# Patient Record
Sex: Female | Born: 1950
Health system: Southern US, Community
[De-identification: ages and names within clinical notes are randomized; demographics above are authoritative.]

---

## 1999-04-21 ENCOUNTER — Other Ambulatory Visit: Admission: RE | Admit: 1999-04-21 | Discharge: 1999-04-21 | Payer: Self-pay | Admitting: Gynecology

## 2000-07-28 ENCOUNTER — Other Ambulatory Visit: Admission: RE | Admit: 2000-07-28 | Discharge: 2000-07-28 | Payer: Self-pay | Admitting: Gynecology

## 2000-08-02 ENCOUNTER — Encounter: Payer: Self-pay | Admitting: Gynecology

## 2000-08-02 ENCOUNTER — Encounter: Admission: RE | Admit: 2000-08-02 | Discharge: 2000-08-02 | Payer: Self-pay | Admitting: Gynecology

## 2001-12-20 ENCOUNTER — Other Ambulatory Visit: Admission: RE | Admit: 2001-12-20 | Discharge: 2001-12-20 | Payer: Self-pay | Admitting: Gynecology

## 2001-12-25 ENCOUNTER — Encounter: Payer: Self-pay | Admitting: Gynecology

## 2001-12-25 ENCOUNTER — Encounter: Admission: RE | Admit: 2001-12-25 | Discharge: 2001-12-25 | Payer: Self-pay | Admitting: Gynecology

## 2002-12-24 ENCOUNTER — Other Ambulatory Visit: Admission: RE | Admit: 2002-12-24 | Discharge: 2002-12-24 | Payer: Self-pay | Admitting: Gynecology

## 2002-12-24 ENCOUNTER — Encounter: Payer: Self-pay | Admitting: Gynecology

## 2002-12-24 ENCOUNTER — Encounter: Admission: RE | Admit: 2002-12-24 | Discharge: 2002-12-24 | Payer: Self-pay | Admitting: Gynecology

## 2004-01-14 ENCOUNTER — Other Ambulatory Visit: Admission: RE | Admit: 2004-01-14 | Discharge: 2004-01-14 | Payer: Self-pay | Admitting: Gynecology

## 2004-01-22 ENCOUNTER — Encounter: Admission: RE | Admit: 2004-01-22 | Discharge: 2004-01-22 | Payer: Self-pay | Admitting: Gynecology

## 2005-03-30 ENCOUNTER — Other Ambulatory Visit: Admission: RE | Admit: 2005-03-30 | Discharge: 2005-03-30 | Payer: Self-pay | Admitting: Gynecology

## 2005-03-31 ENCOUNTER — Encounter: Admission: RE | Admit: 2005-03-31 | Discharge: 2005-03-31 | Payer: Self-pay | Admitting: Gynecology

## 2006-04-19 ENCOUNTER — Other Ambulatory Visit: Admission: RE | Admit: 2006-04-19 | Discharge: 2006-04-19 | Payer: Self-pay | Admitting: Gynecology

## 2006-04-19 ENCOUNTER — Encounter: Admission: RE | Admit: 2006-04-19 | Discharge: 2006-04-19 | Payer: Self-pay | Admitting: Gynecology

## 2006-04-19 ENCOUNTER — Encounter (INDEPENDENT_AMBULATORY_CARE_PROVIDER_SITE_OTHER): Payer: Self-pay | Admitting: Gynecology

## 2007-01-01 ENCOUNTER — Encounter: Admission: RE | Admit: 2007-01-01 | Discharge: 2007-01-01 | Payer: Self-pay | Admitting: Gynecology

## 2007-02-13 ENCOUNTER — Encounter: Admission: RE | Admit: 2007-02-13 | Discharge: 2007-02-13 | Payer: Self-pay | Admitting: Gastroenterology

## 2007-06-22 ENCOUNTER — Other Ambulatory Visit: Admission: RE | Admit: 2007-06-22 | Discharge: 2007-06-22 | Payer: Self-pay | Admitting: Gynecology

## 2007-06-22 ENCOUNTER — Encounter: Admission: RE | Admit: 2007-06-22 | Discharge: 2007-06-22 | Payer: Self-pay | Admitting: Gynecology

## 2007-08-13 ENCOUNTER — Encounter (INDEPENDENT_AMBULATORY_CARE_PROVIDER_SITE_OTHER): Payer: Self-pay | Admitting: Gynecology

## 2007-08-14 ENCOUNTER — Inpatient Hospital Stay (HOSPITAL_COMMUNITY): Admission: RE | Admit: 2007-08-14 | Discharge: 2007-08-15 | Payer: Self-pay | Admitting: Gynecology

## 2008-09-17 ENCOUNTER — Encounter: Admission: RE | Admit: 2008-09-17 | Discharge: 2008-09-17 | Payer: Self-pay | Admitting: Gynecology

## 2009-10-30 ENCOUNTER — Encounter: Admission: RE | Admit: 2009-10-30 | Discharge: 2009-10-30 | Payer: Self-pay | Admitting: Gynecology

## 2011-03-29 NOTE — Op Note (Signed)
[name], [name]             ACCOUNT NO.:  [id number]   MEDICAL RECORD NO.:  [id number]          [name] TYPE:  OIB   LOCATION:  1528                         FACILITY:  [facility]   PHYSICIAN:  Gretta Cool, M.D. DATE OF BIRTH:  Aug 01, 1951   DATE OF PROCEDURE:  08/12/2007  DATE OF DISCHARGE:                               OPERATIVE REPORT   PREOPERATIVE DIAGNOSIS:  Pelvic organ prolapse, global pelvic support  weakness with grade 3 cystocele, grade 2 uterine prolapse, grade 2  rectocele and enterocele, with severe fascial detachment.   POSTOPERATIVE DIAGNOSIS:  Pelvic organ prolapse, global pelvic support  weakness with grade 3 cystocele, grade 2 uterine prolapse, grade 2  rectocele and enterocele, with severe fascial detachment.   PROCEDURES:  1. Vaginal hysterectomy.  2. Anterior, posterior enterocele repairs and cardinal-uterosacral      colposuspension.   SURGEON:  Gretta Cool, M.D.   ASSISTANT:  [name]. [name], M.D.   ANESTHESIA:  General orotracheal.   DESCRIPTION OF PROCEDURE:  Under excellent anesthesia as above with the  [name] prepped and draped in the lithotomy position with Foley catheter  draining her bladder, the cervix was grasped and pulled down through the  introitus.  The mucosa was then infiltrated with Xylocaine 1% with  1:200,000 epinephrine.  Mucosa was then incised and the bladder pushed  off the lower uterine segment.  The mucosa was pushed off all the way  around the entire cervix.  The cul-de-sac was then entered by [facility]  scissors.  The cardinal and uterosacral ligaments were then  progressively clamped, cut, sutured and tied with 0 Vicryl.  At this  point to the vesicovaginal plica was entered and a Deaver placed beneath  the bladder.  The uterine vessels were then clamped, cut, sutured and  tied with 0 Vicryl.  The upper portion of the cardinal ligaments beyond  the uterines were then clamped, cut, sutured and tied with 0 Vicryl.  The  uterus was then inverted and the adnexal pedicles clamped across,  cut, sutured and tied with 0 Vicryl.  The examination of the ovaries  revealed no evidence of abnormality.  They were extremely high and  difficult to access.  Because of the degree of difficulty of access, no  attempt was made to remove them.  At this point the peritoneum was  closed by a pursestring suture from anterior peritoneum to the lateral  pedicles to the cul-de-sac.  The pursestring was then tied tight.  At  this point the anterior mucosa was grasped with Allis clamps at the apex  of the vagina and the mucosa incised.  The mucosa was then dissected  from the pubocervical fascia.  The dissection was taken all the way to  the suburethral area.  A very large central fascial defect was noted  with bladder bulging through the fascial defect.  Initially a  pursestring suture was used to reduce the size of the cystocele.  The  cystocele was then progressively closed with a series of interrupted  mattress sutures.  At this point after considerable reduction, a running  suture of 2-0 Vicryl  was used to plicate the fascia from the suburethral  area all the way to the apex of the vaginal cuff.  At the apex of the  bladder pillars were plicated in midline with interrupted sutures of 2-0  Vicryl.  A suburethral suture was placed so as to plicate the  suburethral fascia and elevate the urethra into high intra-abdominal  position and help prevent leakage.  At this point the mucosa was trimmed  and the upper layers of pubocervical fascia and mucosa were closed as a  subcuticular closure from the suburethral area to the apex of the  vaginal cuff.  At this point the posterior repair was undertaken.  The  mucosa was again infiltrated with Xylocaine with epinephrine.  The  introitus mucosa was grasped with Allis clamps and the mucosa incised  and undermined all the way to the apex of the vaginal cuff.  The  enterocele and severe  fascial detachment were encountered near the apex  of the vaginal cuff.  The fascia was separated for a distance of  approximately 4 cm in the midline and 2 cm laterally.  The uterosacral-  cardinal complex was then identified, clamped with Allis clamps and then  sutured with 0 Novofil and secured to the detached perirectal fascia.  The fascia was then secured again to the uterosacral ligament and the  plication tied so as to reattach the perirectal fascia all the way to  the apex as high as possible of the uterosacral-cardinal complex.  At  this point the cardinal-uterosacral ligaments were plicated in the  midline.  The central portion of the fascia was then plicated to the  cardinal-uterosacral complex.  At this point a suture of 0 Vicryl was  used to plicate the perirectal fascia vertically from the apex of the  vaginal cuff to the introitus.  At this point the mucosa was trimmed and  then closed with a running subcuticular closure of 2-0 Vicryl including  the upper in layers of the perirectal fascia.  At the introitus the  perineal body muscles were plicated in the midline with interrupted  sutures of 2-0 Vicryl and the mucosa closed with a subcuticular closure  of 2-0 Vicryl.  At this point the bladder was filled with lactated  Ringer's and a Bonnano suprapubic Cystocath placed and secured with 0  Novofil.  At this point the procedure was terminated without  complication.  The [name] returned to the recovery room in excellent  condition.   ESTIMATED BLOOD LOSS:  Negligible.   COMPLICATIONS:  None.           ______________________________  Gretta Cool, M.D.     CWL/MEDQ  D:  08/13/2007  T:  08/13/2007  Job:  [id number]   cc:   [name]. [name], M.D.  Fax: [phone]

## 2011-03-29 NOTE — H&P (Signed)
[name], [name]             ACCOUNT NO.:  [id number]   MEDICAL RECORD NO.:  [id number]          PATIENT TYPE:  AMB   LOCATION:  DAY                          FACILITY:  [facility]   PHYSICIAN:  Gretta Cool, M.D. DATE OF BIRTH:  1951-10-05   DATE OF ADMISSION:  08/13/2007  DATE OF DISCHARGE:                              HISTORY & PHYSICAL   CHIEF COMPLAINT:  Pelvic organ prolapse.   HISTORY OF PRESENT ILLNESS:  [name] is a 60 year old, gravida 3,  para 3, with a history of difficult forceps delivery with her first  child with right mediolateral episiotomy.  Her second child was a  precipitous delivery with extensive lacerations. Her third child was  less eventful.  She has had known progressive pelvic support problems  for many years.  She has had consultation at [facility] with Dr. Leola Brazil  regarding difficult control of stools and severity of her posterior  pelvic support problems.  She has had continual progression of pelvic  support weakness that has particularly progressed since menopause began.  She is now admitted for definitive therapy for fairly rapid progression  of pelvic support problems as above.   PAST MEDICAL HISTORY:  1. The patient has a history of difficult deliveries x3.  2. She also has a history of cholecystectomy in 1997.  3. She has had 5-6 stools a day and more difficulty with control of      her bowel movements since her cholecystectomy.  4. She also has a history of diverticulitis treated by our office.   PRESENT MEDICATIONS:  Citrucel and fiber supplements only.   ALLERGIES:  None known.   SOCIAL HISTORY:  The patient denies tobacco or significant ethanol  intake.  Denies recreational drugs.  The patient is widowed.  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   FAMILY HISTORY:  Father and brother have early-onset Alzheimer's  dementia.  Mother died at 94 of cancer of the  kidney.  Father also had  carcinoma of the prostate.  Father had cardiovascular disease and bypass  surgery.   REVIEW OF SYSTEMS:  HEENT:  Denies symptoms.  CARDIORESPIRATORY:  Denies  asthma, cough, bronchitis, shortness of breath.  GI and GU:  The patient  has great frequency of voiding daytime, relatively small amounts.  She  has nocturia x1.  She denies significant continence difficulties.  She  does have bowel movements 5-6 times a day unless she avoids fatty foods.  That problem has existed since her cholecystectomy.   PHYSICAL EXAMINATION:  GENERAL:  Well-developed, well-nourished, white  female with BMI of 27.  She has high abdomen to hip ratio.  HEENT:  Pupils equal, round, and reactive to light and accommodation.  Fundi not examined.  Oropharynx clear.  NECK:  Without mass or thyroid enlargement.  NODES:  Node bearing areas are negative.  BREASTS:  Without mass, nodes, nipple discharge.  HEART:  Regular rhythm without murmur or cardiac enlargement.  CHEST:  Clear to percussion and auscultation.  ABDOMEN:  Rather large panniculus with lax  abdominal wall musculature.  She has no organomegaly.  PELVIC:  External genitalia:  Widening of the genital hiatus.  There is  a large cystocele that is visible through the gaping introitus.  With  straining, cystocele delivers through the introitus.  She also has  uterine descensus to the introitus and beyond.  She has very poor  posterior fascial support with particularly severe loss of pelvic  musculature at the site of her mediolateral episiotomy.  She has severe  fascial detachment with rectocele and enterocele.  Her anus is displaced  posteriorly and sufficiently so that it exits almost posteriorly rather  than in her perineum.  Rectovaginal exam confirms.  EXTREMITIES:  Negative.  NEUROLOGIC:  Physiologic.   IMPRESSION:  1. Severe pelvic organ prolapse, global,  with levator plate tears      from mediolateral episiotomy and  difficult deliveries.  2. Mixed pattern of incontinence.  3. History of diverticulitis, history of cholecystectomy, history of      tubal sterilization, history of carcinoma in situ and cervical      cone.           ______________________________  Gretta Cool, M.D.     CWL/MEDQ  D:  08/12/2007  T:  08/12/2007  Job:  [id number]

## 2011-04-01 NOTE — Discharge Summary (Signed)
[name], [name] NO.:  [id number]   MEDICAL RECORD NO.:  [id number]          PATIENT TYPE:  INP   LOCATION:  1528                         FACILITY:  [facility]   PHYSICIAN:  Gretta Cool, M.D. DATE OF BIRTH:  1951-08-09   DATE OF ADMISSION:  08/13/2007  DATE OF DISCHARGE:  08/15/2007                               DISCHARGE SUMMARY   HISTORY OF PRESENT ILLNESS:  Ms. [name] is a 60 year old female gravida  3, para 3 with a history of forceps delivery with her first child with a  right mediolateral episiotomy.  Her second child was a precipitous  delivery with extension lacerations.  Her third child there were no  complications.  She has had known pelvic support problems for many years  and has had consultation at [facility] with Dr. Doy Hutching regarding difficulty  in controlling her stools and posterior pelvic problems.  She has  continued with progression of her pelvic support weakness and is now  admitted for definitive therapy for rapid progression of pelvic support  weakness.   ADMISSION EXAMINATION:  CHEST:  Clear to A&P.  HEART:  Rate and rhythm were regular without murmur, gallop, or cardiac  enlargement.  ABDOMEN:  Is rather large panniculus with relaxed abdominal wall  musculature.  There is no organomegaly.  PELVIC EXAM:  External genitalia within normal limits for female with  the exception of widening of the genital hiatus.  There is a large  cystocele that is visible through the gaping introitus.  This cystocele  delivers through the introitus with straining.  She has uterine  descensus to the introitus and beyond.  There is very poor posterior  fascial support with severe loss of pelvic musculature at the site of  her mediolateral episiotomy.  There is severe fascial detachment with  rectocele and enterocele.  The anus is displaced posteriorly and to the  point that it exits almost posteriorly rather than in the perineum.  Rectovaginal exam  confirms.   IMPRESSION:  1. Severe pelvic organ prolapse which is global with levator plate      tears from the mediolateral episiotomy secondary to difficult      deliveries.  2. Mixed pattern of incontinence.  3. History of diverticulitis, cholecystectomy, tubal sterilization and      a history of carcinoma in situ, and cervical cone.   Risks and benefits were discussed with the patient and she accepted  these procedures.   LABORATORY DATA:  EKG shows normal sinus rhythm, normal ECG.   LABORATORY:  Findings of hemoglobin prior to admission was 13.2 and  hematocrit 38.4.  On the day of the surgery hemoglobin was 12.1,  hematocrit 34.9.  Urine pregnancy test was negative.   HOSPITAL COURSE:  The patient underwent vaginal hysterectomy; anterior,  posterior and enterocele repairs; and cardinal uterosacral  colposuspension under general anesthesia.  The procedures were completed  without any complications and the patient was returned to the recovery  room in excellent condition.  Pathology report revealed the uterus and  cervix leiomyomata, adenomyosis; endometrium benign proliferative  endometrium, no evidence of hyperplasia or malignancy; and the  cervix,  Nabothian cyst, no dysplasia or malignancy.  Her postoperative course  was without complications and the patient was discharged on the second  postoperative day in excellent condition.   FINAL DISCHARGE INSTRUCTIONS:  Included no heavy lifting or straining,  no vaginal entrance, and to increase ambulation as tolerated.  She is to  call for a fever over 100.4 or failure of daily improvement.   DIET:  Regular.   MEDICATIONS:  She is to return her preoperative medications as well as  Percocet one p.r.n. q.4h. p.r.n. discomfort.  She is discharged with her  suprapubic catheter intact and was instructed to clamp up to 4 hours and  then drain the bladder.  She is to keep a diary of her bleeding and  residuals.  She is to call the  office for an appointment when she has  residuals less than 100 mL.   FINAL DISCHARGE DIAGNOSIS:  Severe pelvic organ prolapse, global, with a  grade 3 cystocele, grade 2 uterine prolapse, grade 2 rectocele and  enterocele with severe fascial detachment.   PROCEDURES PERFORMED:  1. Vaginal hysterectomy.  2. Anterior and posterior enterocele repair and cardinal uterosacral      colposuspension under general anesthesia.      Matt Holmes, N.P.    ______________________________  Gretta Cool, M.D.    EMK/MEDQ  D:  09/06/2007  T:  09/07/2007  Job:  [id number]

## 2011-06-07 ENCOUNTER — Other Ambulatory Visit: Payer: Self-pay | Admitting: Gynecology

## 2011-06-07 DIAGNOSIS — Z1231 Encounter for screening mammogram for malignant neoplasm of breast: Secondary | ICD-10-CM

## 2011-06-24 ENCOUNTER — Other Ambulatory Visit: Payer: Self-pay | Admitting: Gynecology

## 2011-06-24 ENCOUNTER — Ambulatory Visit
Admission: RE | Admit: 2011-06-24 | Discharge: 2011-06-24 | Disposition: A | Discharge status: Home / Self Care | Payer: BC Managed Care – PPO | Source: Ambulatory Visit | Attending: Gynecology | Admitting: Gynecology

## 2011-06-24 DIAGNOSIS — Z1231 Encounter for screening mammogram for malignant neoplasm of breast: Secondary | ICD-10-CM

## 2011-08-25 LAB — HEMOGLOBIN AND HEMATOCRIT, BLOOD
HCT: 34.9 — ABNORMAL LOW
HCT: 38.4
Hemoglobin: 12.1
Hemoglobin: 13.2

## 2012-07-02 ENCOUNTER — Other Ambulatory Visit: Payer: Self-pay | Admitting: Gynecology

## 2012-07-02 DIAGNOSIS — Z1231 Encounter for screening mammogram for malignant neoplasm of breast: Secondary | ICD-10-CM

## 2012-07-10 ENCOUNTER — Other Ambulatory Visit: Payer: Self-pay | Admitting: Gynecology

## 2012-07-10 ENCOUNTER — Ambulatory Visit
Admission: RE | Admit: 2012-07-10 | Discharge: 2012-07-10 | Disposition: A | Discharge status: Home / Self Care | Payer: BC Managed Care – PPO | Source: Ambulatory Visit | Attending: Gynecology | Admitting: Gynecology

## 2012-07-10 DIAGNOSIS — Z1231 Encounter for screening mammogram for malignant neoplasm of breast: Secondary | ICD-10-CM

## 2014-09-10 ENCOUNTER — Other Ambulatory Visit: Payer: Self-pay

## 2014-09-10 DIAGNOSIS — Z1231 Encounter for screening mammogram for malignant neoplasm of breast: Secondary | ICD-10-CM

## 2014-10-07 ENCOUNTER — Ambulatory Visit
Admission: RE | Admit: 2014-10-07 | Discharge: 2014-10-07 | Disposition: A | Discharge status: Home / Self Care | Payer: BC Managed Care – PPO | Source: Ambulatory Visit

## 2014-10-07 DIAGNOSIS — Z1231 Encounter for screening mammogram for malignant neoplasm of breast: Secondary | ICD-10-CM

## 2016-04-06 ENCOUNTER — Other Ambulatory Visit: Payer: Self-pay

## 2016-04-06 DIAGNOSIS — Z1231 Encounter for screening mammogram for malignant neoplasm of breast: Secondary | ICD-10-CM

## 2016-04-26 ENCOUNTER — Ambulatory Visit
Admission: RE | Admit: 2016-04-26 | Discharge: 2016-04-26 | Disposition: A | Discharge status: Home / Self Care | Payer: BLUE CROSS/BLUE SHIELD | Source: Ambulatory Visit

## 2016-04-26 DIAGNOSIS — Z1231 Encounter for screening mammogram for malignant neoplasm of breast: Secondary | ICD-10-CM

## 2018-08-30 DIAGNOSIS — E785 Hyperlipidemia, unspecified: Secondary | ICD-10-CM | POA: Diagnosis not present

## 2018-08-30 DIAGNOSIS — Z79899 Other long term (current) drug therapy: Secondary | ICD-10-CM | POA: Diagnosis not present

## 2018-08-30 DIAGNOSIS — E1129 Type 2 diabetes mellitus with other diabetic kidney complication: Secondary | ICD-10-CM | POA: Diagnosis not present

## 2018-08-30 DIAGNOSIS — Z6824 Body mass index (BMI) 24.0-24.9, adult: Secondary | ICD-10-CM | POA: Diagnosis not present

## 2018-08-30 DIAGNOSIS — R809 Proteinuria, unspecified: Secondary | ICD-10-CM | POA: Diagnosis not present

## 2018-12-17 DIAGNOSIS — E119 Type 2 diabetes mellitus without complications: Secondary | ICD-10-CM | POA: Diagnosis not present

## 2018-12-17 DIAGNOSIS — H2513 Age-related nuclear cataract, bilateral: Secondary | ICD-10-CM | POA: Diagnosis not present

## 2019-03-28 DIAGNOSIS — E1129 Type 2 diabetes mellitus with other diabetic kidney complication: Secondary | ICD-10-CM | POA: Diagnosis not present

## 2019-04-04 DIAGNOSIS — E1129 Type 2 diabetes mellitus with other diabetic kidney complication: Secondary | ICD-10-CM | POA: Diagnosis not present

## 2019-04-04 DIAGNOSIS — R809 Proteinuria, unspecified: Secondary | ICD-10-CM | POA: Diagnosis not present

## 2019-04-04 DIAGNOSIS — L658 Other specified nonscarring hair loss: Secondary | ICD-10-CM | POA: Diagnosis not present

## 2019-04-04 DIAGNOSIS — E785 Hyperlipidemia, unspecified: Secondary | ICD-10-CM | POA: Diagnosis not present

## 2019-07-11 DIAGNOSIS — Z6825 Body mass index (BMI) 25.0-25.9, adult: Secondary | ICD-10-CM | POA: Diagnosis not present

## 2019-07-11 DIAGNOSIS — R197 Diarrhea, unspecified: Secondary | ICD-10-CM | POA: Diagnosis not present

## 2019-07-11 DIAGNOSIS — K5732 Diverticulitis of large intestine without perforation or abscess without bleeding: Secondary | ICD-10-CM | POA: Diagnosis not present

## 2019-07-11 DIAGNOSIS — R1032 Left lower quadrant pain: Secondary | ICD-10-CM | POA: Diagnosis not present

## 2019-07-19 DIAGNOSIS — Z6825 Body mass index (BMI) 25.0-25.9, adult: Secondary | ICD-10-CM | POA: Diagnosis not present

## 2019-07-19 DIAGNOSIS — M25511 Pain in right shoulder: Secondary | ICD-10-CM | POA: Diagnosis not present

## 2019-07-19 DIAGNOSIS — K5792 Diverticulitis of intestine, part unspecified, without perforation or abscess without bleeding: Secondary | ICD-10-CM | POA: Diagnosis not present

## 2019-07-19 DIAGNOSIS — M7581 Other shoulder lesions, right shoulder: Secondary | ICD-10-CM | POA: Diagnosis not present

## 2019-08-23 ENCOUNTER — Other Ambulatory Visit: Payer: Self-pay | Admitting: Family Medicine

## 2019-08-23 DIAGNOSIS — Z1231 Encounter for screening mammogram for malignant neoplasm of breast: Secondary | ICD-10-CM

## 2019-10-09 ENCOUNTER — Ambulatory Visit
Admission: RE | Admit: 2019-10-09 | Discharge: 2019-10-09 | Disposition: A | Discharge status: Home / Self Care | Payer: Medicare Other | Source: Ambulatory Visit | Attending: Family Medicine | Admitting: Family Medicine

## 2019-10-09 ENCOUNTER — Other Ambulatory Visit: Payer: Self-pay

## 2019-10-09 DIAGNOSIS — Z1231 Encounter for screening mammogram for malignant neoplasm of breast: Secondary | ICD-10-CM

## 2019-10-15 DIAGNOSIS — M7581 Other shoulder lesions, right shoulder: Secondary | ICD-10-CM | POA: Diagnosis not present

## 2019-10-15 DIAGNOSIS — E1129 Type 2 diabetes mellitus with other diabetic kidney complication: Secondary | ICD-10-CM | POA: Diagnosis not present

## 2019-10-15 DIAGNOSIS — E785 Hyperlipidemia, unspecified: Secondary | ICD-10-CM | POA: Diagnosis not present

## 2019-10-15 DIAGNOSIS — Z0000 Encounter for general adult medical examination without abnormal findings: Secondary | ICD-10-CM | POA: Diagnosis not present

## 2019-10-15 DIAGNOSIS — Z79899 Other long term (current) drug therapy: Secondary | ICD-10-CM | POA: Diagnosis not present

## 2019-10-15 DIAGNOSIS — R809 Proteinuria, unspecified: Secondary | ICD-10-CM | POA: Diagnosis not present

## 2019-11-20 DIAGNOSIS — M25511 Pain in right shoulder: Secondary | ICD-10-CM | POA: Diagnosis not present

## 2019-11-22 DIAGNOSIS — M25511 Pain in right shoulder: Secondary | ICD-10-CM | POA: Diagnosis not present

## 2020-01-03 DIAGNOSIS — M25511 Pain in right shoulder: Secondary | ICD-10-CM | POA: Diagnosis not present

## 2020-01-16 DIAGNOSIS — Z23 Encounter for immunization: Secondary | ICD-10-CM | POA: Diagnosis not present

## 2020-02-20 DIAGNOSIS — Z23 Encounter for immunization: Secondary | ICD-10-CM | POA: Diagnosis not present

## 2020-04-07 DIAGNOSIS — R809 Proteinuria, unspecified: Secondary | ICD-10-CM | POA: Diagnosis not present

## 2020-04-07 DIAGNOSIS — E785 Hyperlipidemia, unspecified: Secondary | ICD-10-CM | POA: Diagnosis not present

## 2020-04-07 DIAGNOSIS — R0600 Dyspnea, unspecified: Secondary | ICD-10-CM | POA: Diagnosis not present

## 2020-04-07 DIAGNOSIS — E1129 Type 2 diabetes mellitus with other diabetic kidney complication: Secondary | ICD-10-CM | POA: Diagnosis not present

## 2020-04-07 DIAGNOSIS — Z79899 Other long term (current) drug therapy: Secondary | ICD-10-CM | POA: Diagnosis not present

## 2020-12-01 DIAGNOSIS — Z86010 Personal history of colonic polyps: Secondary | ICD-10-CM | POA: Diagnosis not present

## 2020-12-01 DIAGNOSIS — K5730 Diverticulosis of large intestine without perforation or abscess without bleeding: Secondary | ICD-10-CM | POA: Diagnosis not present

## 2020-12-01 DIAGNOSIS — Z1211 Encounter for screening for malignant neoplasm of colon: Secondary | ICD-10-CM | POA: Diagnosis not present

## 2020-12-15 DIAGNOSIS — E1129 Type 2 diabetes mellitus with other diabetic kidney complication: Secondary | ICD-10-CM | POA: Diagnosis not present

## 2020-12-16 DIAGNOSIS — Z0000 Encounter for general adult medical examination without abnormal findings: Secondary | ICD-10-CM | POA: Diagnosis not present

## 2020-12-16 DIAGNOSIS — E1129 Type 2 diabetes mellitus with other diabetic kidney complication: Secondary | ICD-10-CM | POA: Diagnosis not present

## 2020-12-16 DIAGNOSIS — E785 Hyperlipidemia, unspecified: Secondary | ICD-10-CM | POA: Diagnosis not present

## 2020-12-16 DIAGNOSIS — N951 Menopausal and female climacteric states: Secondary | ICD-10-CM | POA: Diagnosis not present

## 2020-12-16 DIAGNOSIS — R809 Proteinuria, unspecified: Secondary | ICD-10-CM | POA: Diagnosis not present

## 2020-12-31 ENCOUNTER — Ambulatory Visit: Payer: Medicare Other | Admitting: Endocrinology

## 2020-12-31 ENCOUNTER — Other Ambulatory Visit: Payer: Self-pay | Admitting: Family Medicine

## 2020-12-31 DIAGNOSIS — Z1231 Encounter for screening mammogram for malignant neoplasm of breast: Secondary | ICD-10-CM

## 2021-01-07 ENCOUNTER — Other Ambulatory Visit: Payer: Self-pay | Admitting: Nurse Practitioner

## 2021-01-07 DIAGNOSIS — N952 Postmenopausal atrophic vaginitis: Secondary | ICD-10-CM | POA: Diagnosis not present

## 2021-01-07 DIAGNOSIS — N6452 Nipple discharge: Secondary | ICD-10-CM | POA: Diagnosis not present

## 2021-01-07 DIAGNOSIS — Z01419 Encounter for gynecological examination (general) (routine) without abnormal findings: Secondary | ICD-10-CM | POA: Diagnosis not present

## 2021-01-07 DIAGNOSIS — M8580 Other specified disorders of bone density and structure, unspecified site: Secondary | ICD-10-CM | POA: Diagnosis not present

## 2021-01-14 DIAGNOSIS — R102 Pelvic and perineal pain: Secondary | ICD-10-CM | POA: Diagnosis not present

## 2021-01-14 DIAGNOSIS — Z90710 Acquired absence of both cervix and uterus: Secondary | ICD-10-CM | POA: Diagnosis not present

## 2021-02-03 ENCOUNTER — Other Ambulatory Visit: Payer: Self-pay

## 2021-02-03 ENCOUNTER — Encounter: Payer: Self-pay | Admitting: Endocrinology

## 2021-02-03 ENCOUNTER — Ambulatory Visit (INDEPENDENT_AMBULATORY_CARE_PROVIDER_SITE_OTHER): Payer: HMO | Admitting: Endocrinology

## 2021-02-03 VITALS — BP 150/80 | HR 80 | Ht 65.5 in | Wt 151.4 lb

## 2021-02-03 DIAGNOSIS — E119 Type 2 diabetes mellitus without complications: Secondary | ICD-10-CM | POA: Diagnosis not present

## 2021-02-03 DIAGNOSIS — E1369 Other specified diabetes mellitus with other specified complication: Secondary | ICD-10-CM

## 2021-02-03 LAB — POCT GLYCOSYLATED HEMOGLOBIN (HGB A1C): Hemoglobin A1C: 7.7 % — AB (ref 4.0–5.6)

## 2021-02-03 MED ORDER — CANAGLIFLOZIN 300 MG PO TABS: 300.0000 mg | ORAL_TABLET | Freq: Every day | ORAL | 11 refills | Status: DC

## 2021-02-03 MED ORDER — METFORMIN HCL ER 500 MG PO TB24: 2000.0000 mg | ORAL_TABLET | Freq: Every day | ORAL | 3 refills | Status: AC

## 2021-02-03 MED ORDER — GLIPIZIDE 5 MG PO TABS: 5.0000 mg | ORAL_TABLET | Freq: Every day | ORAL | 3 refills | Status: DC

## 2021-02-03 NOTE — Progress Notes (Signed)
Subjective:    Patient ID: [name], female    DOB: 03/03/1951, 70 y.o.   MRN: [id number]  HPI pt is referred by Dr Casper Harrison, for diabetes.  Pt states DM was dx'ed in 2002; it is complicated by PN; she has never been on insulin; pt says her diet and exercise are fair; she has never had GDM, pancreatitis, pancreatic surgery, severe hypoglycemia or DKA.  She has slight nausea.  she brings her meter with her cbg's which I have reviewed today.  cbg varies from 158-266.   No past medical history on file.  No past surgical history on file.  Social History   Socioeconomic History  . Marital status: Widowed    Spouse name: Not on file  . Number of children: Not on file  . Years of education: Not on file  . Highest education level: Not on file  Occupational History  . Not on file  Tobacco Use  . Smoking status: Not on file  . Smokeless tobacco: Not on file  Substance and Sexual Activity  . Alcohol use: Not on file  . Drug use: Not on file  . Sexual activity: Not on file  Other Topics Concern  . Not on file  Social History Narrative  . Not on file   Social Determinants of Health   Financial Resource Strain: Not on file  Food Insecurity: Not on file  Transportation Needs: Not on file  Physical Activity: Not on file  Stress: Not on file  Social Connections: Not on file  Intimate Partner Violence: Not on file    No current outpatient medications on file prior to visit.   No current facility-administered medications on file prior to visit.    Not on File  Family History  Problem Relation Age of Onset  . Diabetes Mother     BP (!) 150/80 (BP Location: Right Arm, Patient Position: Sitting, Cuff Size: Normal)   Pulse 80   Ht 5' 5.5" (1.664 m)   Wt 151 lb 6.4 oz (68.7 kg)   SpO2 96%   BMI 24.81 kg/m     Review of Systems denies weight loss, blurry vision, sob, hypoglycemia, and depression.      Objective:   Physical Exam VITAL SIGNS:  See vs page GENERAL:  no distress Pulses: dorsalis pedis intact bilat.   MSK: no deformity of the feet CV: no leg edema Skin:  no ulcer on the feet.  normal color and temp on the feet. Neuro: sensation is intact to touch on the feet  Lab Results  Component Value Date   HGBA1C 7.7 (A) 02/03/2021   I have reviewed outside records, and summarized: Pt was noted to have elevated A1c, and referred here.  Pt reported paresthesias of the feet, and slight nausea      Assessment & Plan:  Type 2 DM: uncontrolled   Patient Instructions  good diet and exercise significantly improve the control of your diabetes.  please let me know if you wish to be referred to a dietician.  high blood sugar is very risky to your health.  you should see an eye doctor and dentist every year.  It is very important to get all recommended vaccinations.  Controlling your blood pressure and cholesterol drastically reduces the damage diabetes does to your body.  Those who smoke should quit.  Please discuss these with your doctor.  check your blood sugar once a day.  vary the time of day when you check, between before  the 3 meals, and at bedtime.  also check if you have symptoms of your blood sugar being too high or too low.  please keep a record of the readings and bring it to your next appointment here (or you can bring the meter itself).  You can write it on any piece of paper.  please call us sooner if your blood sugar goes below 70, or if most of your readings are over 200. We will need to take this complex situation in stages I have sent 3 prescription to your pharmacy: see below Please continue the same Januvia for now.   Please come back for a follow-up appointment in 2 months.

## 2021-02-03 NOTE — Patient Instructions (Addendum)
good diet and exercise significantly improve the control of your diabetes.  please let me know if you wish to be referred to a dietician.  high blood sugar is very risky to your health.  you should see an eye doctor and dentist every year.  It is very important to get all recommended vaccinations.  Controlling your blood pressure and cholesterol drastically reduces the damage diabetes does to your body.  Those who smoke should quit.  Please discuss these with your doctor.  check your blood sugar once a day.  vary the time of day when you check, between before the 3 meals, and at bedtime.  also check if you have symptoms of your blood sugar being too high or too low.  please keep a record of the readings and bring it to your next appointment here (or you can bring the meter itself).  You can write it on any piece of paper.  please call us sooner if your blood sugar goes below 70, or if most of your readings are over 200. We will need to take this complex situation in stages I have sent 3 prescription to your pharmacy: see below Please continue the same Januvia for now.   Please come back for a follow-up appointment in 2 months.

## 2021-02-06 DIAGNOSIS — E119 Type 2 diabetes mellitus without complications: Secondary | ICD-10-CM | POA: Insufficient documentation

## 2021-02-06 HISTORY — DX: Type 2 diabetes mellitus without complications: E11.9

## 2021-02-10 ENCOUNTER — Ambulatory Visit
Admission: RE | Admit: 2021-02-10 | Discharge: 2021-02-10 | Disposition: A | Discharge status: Home / Self Care | Payer: HMO | Source: Ambulatory Visit | Attending: Nurse Practitioner | Admitting: Nurse Practitioner

## 2021-02-10 ENCOUNTER — Other Ambulatory Visit: Payer: Self-pay

## 2021-02-10 DIAGNOSIS — N6452 Nipple discharge: Secondary | ICD-10-CM

## 2021-02-10 DIAGNOSIS — R922 Inconclusive mammogram: Secondary | ICD-10-CM | POA: Diagnosis not present

## 2021-02-19 ENCOUNTER — Ambulatory Visit: Payer: HMO

## 2021-04-07 ENCOUNTER — Ambulatory Visit: Payer: HMO | Admitting: Endocrinology

## 2021-04-07 ENCOUNTER — Other Ambulatory Visit: Payer: Self-pay

## 2021-04-07 VITALS — BP 160/80 | HR 108 | Ht 65.5 in | Wt 146.0 lb

## 2021-04-07 DIAGNOSIS — R200 Anesthesia of skin: Secondary | ICD-10-CM

## 2021-04-07 DIAGNOSIS — E119 Type 2 diabetes mellitus without complications: Secondary | ICD-10-CM | POA: Diagnosis not present

## 2021-04-07 DIAGNOSIS — E781 Pure hyperglyceridemia: Secondary | ICD-10-CM | POA: Diagnosis not present

## 2021-04-07 HISTORY — DX: Pure hyperglyceridemia: E78.1

## 2021-04-07 HISTORY — DX: Anesthesia of skin: R20.0

## 2021-04-07 LAB — LIPID PANEL
Cholesterol: 201 mg/dL — ABNORMAL HIGH (ref 0–200)
HDL: 48.4 mg/dL (ref >39.00)
NonHDL: 152.75
Total CHOL/HDL Ratio: 4
Triglycerides: 325 mg/dL — ABNORMAL HIGH (ref 0.0–149.0)
VLDL: 65 mg/dL — ABNORMAL HIGH (ref 0.0–40.0)

## 2021-04-07 LAB — BASIC METABOLIC PANEL
BUN: 11 mg/dL (ref 6–23)
CO2: 26 mEq/L (ref 19–32)
Calcium: 10.2 mg/dL (ref 8.4–10.5)
Chloride: 102 mEq/L (ref 96–112)
Creatinine, Ser: 0.67 mg/dL (ref 0.40–1.20)
GFR: 90.31 mL/min (ref >60.00)
Glucose, Bld: 136 mg/dL — ABNORMAL HIGH (ref 70–99)
Potassium: 4.5 mEq/L (ref 3.5–5.1)
Sodium: 137 mEq/L (ref 135–145)

## 2021-04-07 LAB — POCT GLYCOSYLATED HEMOGLOBIN (HGB A1C): Hemoglobin A1C: 7 % — AB (ref 4.0–5.6)

## 2021-04-07 LAB — TSH: TSH: 1.21 u[IU]/mL (ref 0.35–4.50)

## 2021-04-07 LAB — LDL CHOLESTEROL, DIRECT: Direct LDL: 107 mg/dL

## 2021-04-07 MED ORDER — EMPAGLIFLOZIN 25 MG PO TABS: 25.0000 mg | ORAL_TABLET | Freq: Every day | ORAL | 3 refills | Status: DC

## 2021-04-07 MED ORDER — SITAGLIPTIN PHOSPHATE 100 MG PO TABS: 100.0000 mg | ORAL_TABLET | Freq: Every day | ORAL | 3 refills | Status: DC

## 2021-04-07 NOTE — Addendum Note (Signed)
Addended by: Adline Mango I on: 04/07/2021 01:40 PM   Modules accepted: Orders

## 2021-04-07 NOTE — Progress Notes (Signed)
Subjective:    Patient ID: [name], female    DOB: [birth year], 69 y.o.   MRN: [id number]  HPI Pt returns for f/u of diabetes mellitus: DM type: 2 Dx'ed: 2002 Complications: PN Therapy: 4 oral meds.   GDM: never DKA: never Severe hypoglycemia: never Pancreatitis: never Pancreatic imaging: normal on 2008 CT SDOH: none Other: she has never been on insulin Interval history: Pt reports foot numbness/burning.  She attributes this to invokana.  no cbg record, but states cbg's vary from 130-170.  She is fasting today.   No past medical history on file.  No past surgical history on file.  Social History   Socioeconomic History  . Marital status: Widowed    Spouse name: Not on file  . Number of children: Not on file  . Years of education: Not on file  . Highest education level: Not on file  Occupational History  . Not on file  Tobacco Use  . Smoking status: Not on file  . Smokeless tobacco: Not on file  Substance and Sexual Activity  . Alcohol use: Not on file  . Drug use: Not on file  . Sexual activity: Not on file  Other Topics Concern  . Not on file  Social History Narrative  . Not on file   Social Determinants of Health   Financial Resource Strain: Not on file  Food Insecurity: Not on file  Transportation Needs: Not on file  Physical Activity: Not on file  Stress: Not on file  Social Connections: Not on file  Intimate Partner Violence: Not on file    Current Outpatient Medications on File Prior to Visit  Medication Sig Dispense Refill  . glipiZIDE (GLUCOTROL) 5 MG tablet Take 1 tablet (5 mg total) by mouth daily before breakfast. 90 tablet 3  . metFORMIN (GLUCOPHAGE-XR) 500 MG 24 hr tablet Take 4 tablets (2,000 mg total) by mouth daily with breakfast. 360 tablet 3   No current facility-administered medications on file prior to visit.    No Known Allergies  Family History  Problem Relation Age of Onset  . Diabetes Mother   . Breast cancer Sister    . Breast cancer Other     BP (!) 160/80 (BP Location: Right Arm, Patient Position: Sitting, Cuff Size: Normal)   Pulse (!) 108   Ht 5' 5.5" (1.664 m)   Wt 146 lb (66.2 kg)   SpO2 97%   BMI 23.93 kg/m    Review of Systems She denies hypoglycemia and heartburn.  Slight nausea persists.      Objective:   Physical Exam VITAL SIGNS:  See vs page GENERAL: no distress Pulses: dorsalis pedis intact bilat.   MSK: no deformity of the feet CV: no leg edema Skin:  no ulcer on the feet.  normal color and temp on the feet. Neuro: sensation is intact to touch on the feet  A1c=7.0%      Assessment & Plan:  Type 2 DM: well-controlled Numbness: check B-12 Foot pain: Change Invokana to Jardiance  Patient Instructions  check your blood sugar once a day.  vary the time of day when you check, between before the 3 meals, and at bedtime.  also check if you have symptoms of your blood sugar being too high or too low.  please keep a record of the readings and bring it to your next appointment here (or you can bring the meter itself).  You can write it on any piece of paper.  please call  us sooner if your blood sugar goes below 70, or if most of your readings are over 200.  I have sent a prescription to your pharmacy, to change Invokana to Hackberry.  Blood tests are requested for you today.  We'll let you know about the results.  Please come back for a follow-up appointment in 3 months.

## 2021-04-07 NOTE — Patient Instructions (Addendum)
check your blood sugar once a day.  vary the time of day when you check, between before the 3 meals, and at bedtime.  also check if you have symptoms of your blood sugar being too high or too low.  please keep a record of the readings and bring it to your next appointment here (or you can bring the meter itself).  You can write it on any piece of paper.  please call us sooner if your blood sugar goes below 70, or if most of your readings are over 200.  I have sent a prescription to your pharmacy, to change Invokana to Kraemer.  Blood tests are requested for you today.  We'll let you know about the results.  Please come back for a follow-up appointment in 3 months.

## 2021-04-08 LAB — VITAMIN B12: Vitamin B-12: 310 pg/mL (ref 211–911)

## 2021-07-13 ENCOUNTER — Ambulatory Visit (INDEPENDENT_AMBULATORY_CARE_PROVIDER_SITE_OTHER): Payer: HMO | Admitting: Endocrinology

## 2021-07-13 ENCOUNTER — Other Ambulatory Visit: Payer: Self-pay

## 2021-07-13 VITALS — BP 120/60 | HR 65 | Ht 65.5 in | Wt 146.4 lb

## 2021-07-13 DIAGNOSIS — E119 Type 2 diabetes mellitus without complications: Secondary | ICD-10-CM

## 2021-07-13 LAB — POCT GLYCOSYLATED HEMOGLOBIN (HGB A1C): Hemoglobin A1C: 6.8 % — AB (ref 4.0–5.6)

## 2021-07-13 MED ORDER — SITAGLIPTIN PHOSPHATE 100 MG PO TABS: 100.0000 mg | ORAL_TABLET | Freq: Every day | ORAL | 3 refills | Status: DC

## 2021-07-13 MED ORDER — GLIPIZIDE 5 MG PO TABS: 2.5000 mg | ORAL_TABLET | Freq: Every day | ORAL | 5 refills | Status: DC

## 2021-07-13 NOTE — Progress Notes (Signed)
Subjective:    Patient ID: [name], female    DOB: 1951-05-10, 70 y.o.   MRN: [id number]  HPI Pt returns for f/u of diabetes mellitus: DM type: 2 Dx'ed: 2002 Complications: PN Therapy: 4 oral meds.   GDM: never DKA: never Severe hypoglycemia: never Pancreatitis: never Pancreatic imaging: normal on 2008 CT SDOH: none Other: she has never been on insulin Interval history: no cbg record, but states cbg's are in the mid-100's.    Social History   Socioeconomic History   Marital status: Widowed    Spouse name: Not on file   Number of children: Not on file   Years of education: Not on file   Highest education level: Not on file  Occupational History   Not on file  Tobacco Use   Smoking status: Not on file   Smokeless tobacco: Not on file  Substance and Sexual Activity   Alcohol use: Not on file   Drug use: Not on file   Sexual activity: Not on file  Other Topics Concern   Not on file  Social History Narrative   Not on file   Social Determinants of Health   Financial Resource Strain: Not on file  Food Insecurity: Not on file  Transportation Needs: Not on file  Physical Activity: Not on file  Stress: Not on file  Social Connections: Not on file  Intimate Partner Violence: Not on file    Current Outpatient Medications on File Prior to Visit  Medication Sig Dispense Refill   empagliflozin (JARDIANCE) 25 MG TABS tablet Take 1 tablet (25 mg total) by mouth daily before breakfast. 90 tablet 3   metFORMIN (GLUCOPHAGE-XR) 500 MG 24 hr tablet Take 4 tablets (2,000 mg total) by mouth daily with breakfast. 360 tablet 3   No current facility-administered medications on file prior to visit.    No Known Allergies  Family History  Problem Relation Age of Onset   Diabetes Mother    Breast cancer Sister    Breast cancer Other     BP 120/60 (BP Location: Right Arm, Patient Position: Sitting, Cuff Size: Normal)   Pulse 65   Ht 5' 5.5" (1.664 m)   Wt 146 lb 6.4  oz (66.4 kg)   SpO2 96%   BMI 23.99 kg/m    Review of Systems She denies hypoglycemia    Objective:   Physical Exam Pulses: dorsalis pedis intact bilat.   MSK: no deformity of the feet CV: no leg edema Skin:  no ulcer on the feet.  Several insect bites.  normal color and temp on the feet.  Neuro: sensation is intact to touch on the feet, but decreased from normal.    A1c=6.8%     Assessment & Plan:  Type 2 DM: overcontrolled, for this SU-containing regimen  Patient Instructions  check your blood sugar once a day.  vary the time of day when you check, between before the 3 meals, and at bedtime.  also check if you have symptoms of your blood sugar being too high or too low.  please keep a record of the readings and bring it to your next appointment here (or you can bring the meter itself).  You can write it on any piece of paper.  please call us sooner if your blood sugar goes below 70, or if most of your readings are over 200.  I have sent a prescription to your pharmacy, to half the glipizide.  Please continue the same other medications Please  come back for a follow-up appointment in January.

## 2021-07-13 NOTE — Patient Instructions (Addendum)
check your blood sugar once a day.  vary the time of day when you check, between before the 3 meals, and at bedtime.  also check if you have symptoms of your blood sugar being too high or too low.  please keep a record of the readings and bring it to your next appointment here (or you can bring the meter itself).  You can write it on any piece of paper.  please call us sooner if your blood sugar goes below 70, or if most of your readings are over 200.  I have sent a prescription to your pharmacy, to half the glipizide.  Please continue the same other medications Please come back for a follow-up appointment in January.

## 2021-09-23 DIAGNOSIS — M8580 Other specified disorders of bone density and structure, unspecified site: Secondary | ICD-10-CM | POA: Diagnosis not present

## 2021-09-23 DIAGNOSIS — G63 Polyneuropathy in diseases classified elsewhere: Secondary | ICD-10-CM | POA: Diagnosis not present

## 2021-09-23 DIAGNOSIS — Z79899 Other long term (current) drug therapy: Secondary | ICD-10-CM | POA: Diagnosis not present

## 2021-09-23 DIAGNOSIS — E1129 Type 2 diabetes mellitus with other diabetic kidney complication: Secondary | ICD-10-CM | POA: Diagnosis not present

## 2021-09-23 DIAGNOSIS — E785 Hyperlipidemia, unspecified: Secondary | ICD-10-CM | POA: Diagnosis not present

## 2021-09-23 DIAGNOSIS — R809 Proteinuria, unspecified: Secondary | ICD-10-CM | POA: Diagnosis not present

## 2021-11-23 ENCOUNTER — Ambulatory Visit: Payer: HMO | Admitting: Endocrinology

## 2021-12-04 IMAGING — MG DIGITAL DIAGNOSTIC BILAT W/ TOMO W/ CAD
8 of 14 series · 8 of 40 positions shown · non-contrast
Comparison: Previous exam(s).

CLINICAL DATA: 69-year-old female presenting with bilateral nipple
discharge. Patient reports a single episode of left clear nipple
discharge 6 months ago that has not recurred. Additionally she
reports a small pimple on the right nipple that has drained white
pus-like fluid twice.



[R MLO synth-2D (1 of 2)]
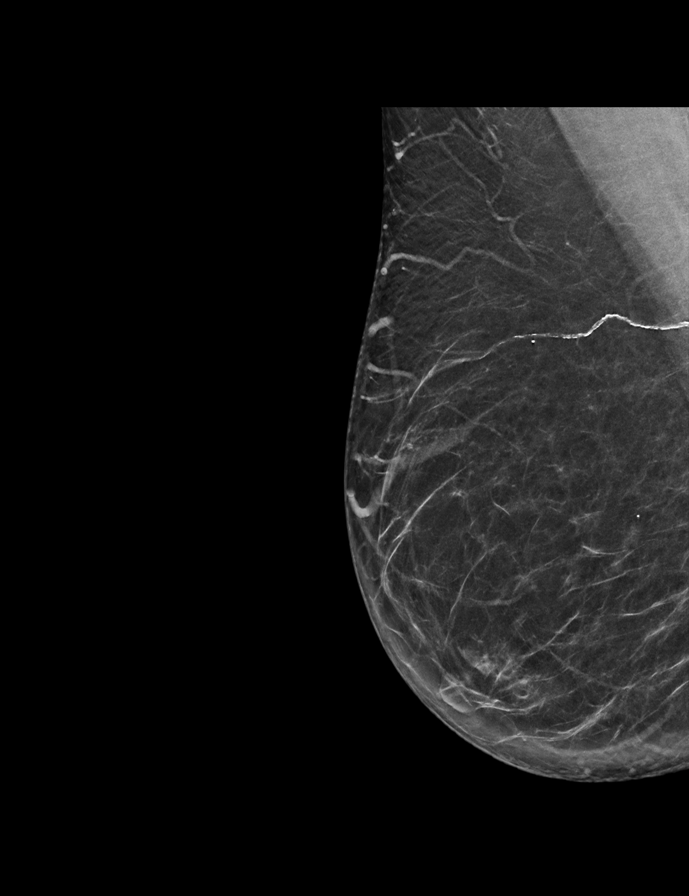

[L CC synth-2D]
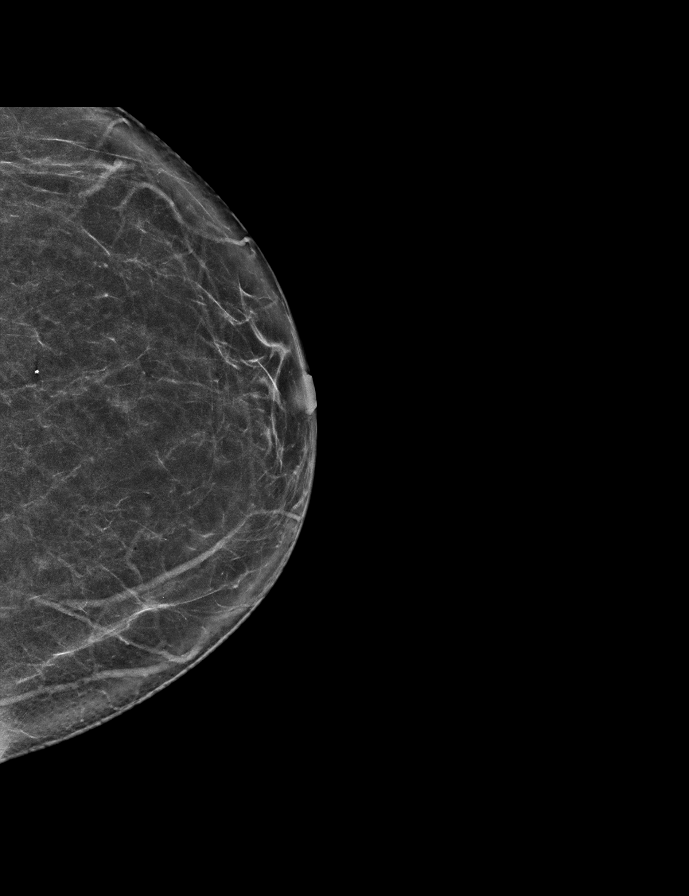

[L MLO synth-2D (1 of 2)]
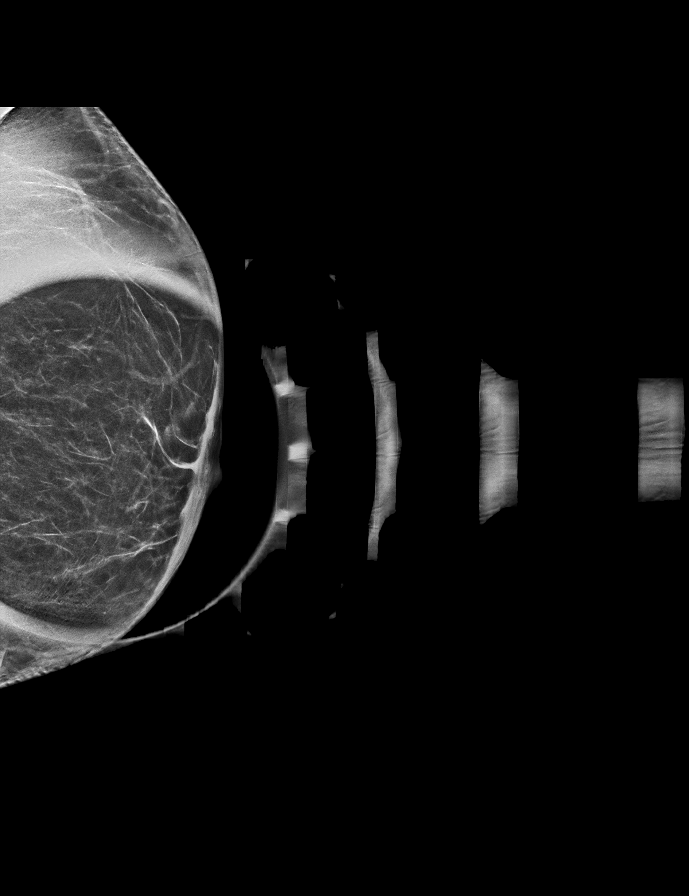

[R CC synth-2D (1 of 2)]
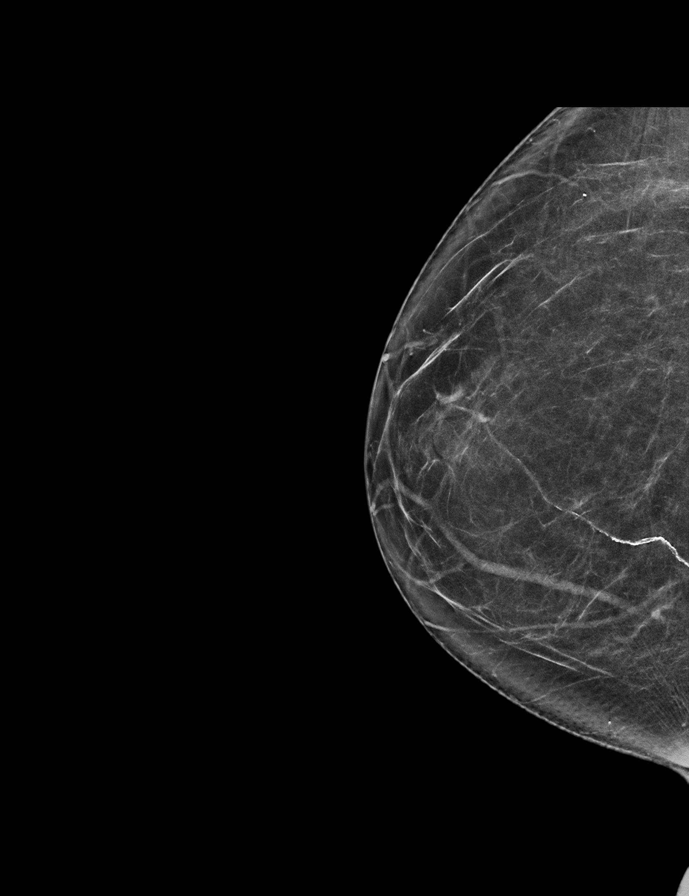

[R MLO synth-2D (2 of 2)]
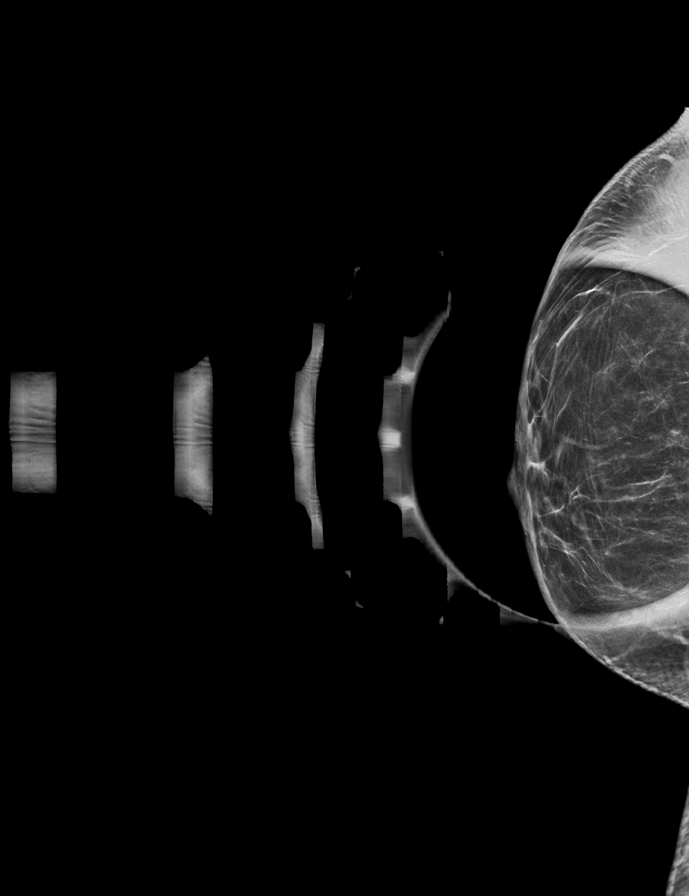

[R CC synth-2D (2 of 2)]
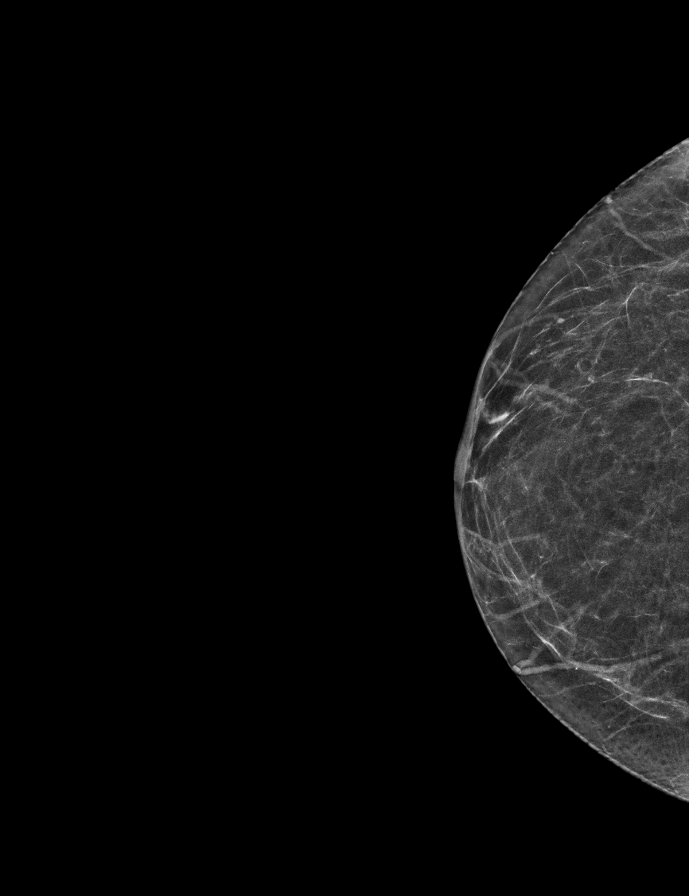

[L MLO synth-2D (2 of 2)]
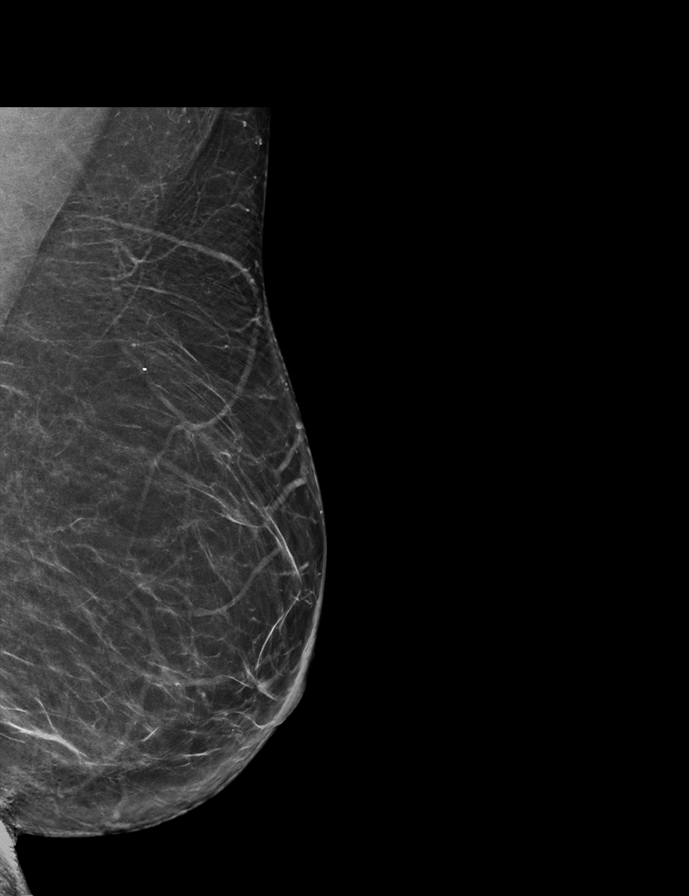

[L CC tomo · tomo slice 35/68.0]
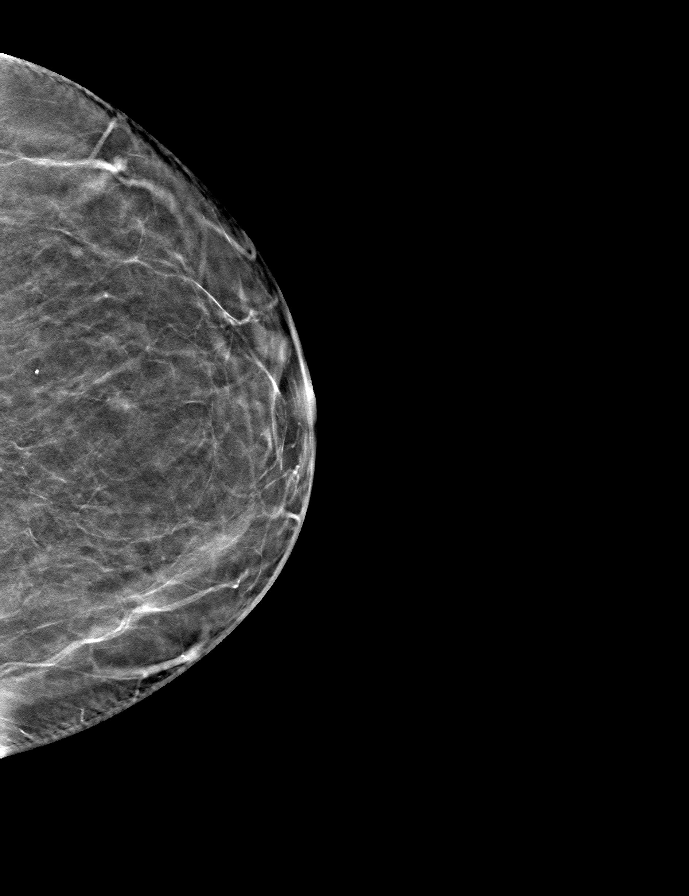

[8 of 40 positions shown; findings below may reference images not displayed]

ACR Breast Density Category b: There are scattered areas of
fibroglandular density.
FINDINGS: Mammogram:

Right breast: A spot compression tomosynthesis view of the
retroareolar right breast was performed in addition to standard
views. There is no new abnormality in the retroareolar region or
elsewhere in the right breast.

Left breast: A spot compression tomosynthesis view of the
retroareolar left breast was performed in addition to standard
views. There is no new abnormality in the retroareolar region or
elsewhere in the left breast.

On physical exam of the left breast I do not appreciate any rash or
abnormality of the nipple-areolar complex.

On physical exam of the right breast there is a small papule on the
top of the nipple. No erythema or skin breakdown.

Ultrasound:

Right breast: Targeted ultrasound performed in the retroareolar
aspect of the right breast demonstrating no cystic or solid mass. No
dilated duct or fluid collection. There is no obvious mass within
the nipple itself.

Left breast: Targeted ultrasound performed in the retroareolar
aspect of the left breast demonstrating no cystic or solid mass. No
dilated duct or fluid collection.
IMPRESSION: 1. There is a small papule on the right nipple, likely a clogged
duct. There is no clinical sign of infection. No evidence of
malignancy.

2. No mammographic or sonographic evidence of malignancy in the
retroareolar left breast. Patient had a single episode of clear
nipple discharge that has not recurred.

RECOMMENDATION:
1. Clinical follow-up as needed for the right nipple. Recommended
patient try warm compresses to alleviate the clogged duct.

2. No further workup for the single episode of left nipple
discharge. Discussed with the patient that if the discharge recurs,
would recommend proceeding with breast MRI.

3.  Screening mammogram in one year.(Code:S0-A-9LR)

I have discussed the findings and recommendations with the patient.
If applicable, a reminder letter will be sent to the patient
regarding the next appointment.

BI-RADS CATEGORY  2: Benign.

## 2022-02-21 DIAGNOSIS — E785 Hyperlipidemia, unspecified: Secondary | ICD-10-CM | POA: Diagnosis not present

## 2022-02-21 DIAGNOSIS — E1129 Type 2 diabetes mellitus with other diabetic kidney complication: Secondary | ICD-10-CM | POA: Diagnosis not present

## 2022-02-22 DIAGNOSIS — M8580 Other specified disorders of bone density and structure, unspecified site: Secondary | ICD-10-CM | POA: Diagnosis not present

## 2022-02-22 DIAGNOSIS — E785 Hyperlipidemia, unspecified: Secondary | ICD-10-CM | POA: Diagnosis not present

## 2022-02-22 DIAGNOSIS — G63 Polyneuropathy in diseases classified elsewhere: Secondary | ICD-10-CM | POA: Diagnosis not present

## 2022-02-22 DIAGNOSIS — L658 Other specified nonscarring hair loss: Secondary | ICD-10-CM | POA: Diagnosis not present

## 2022-02-22 DIAGNOSIS — Z1331 Encounter for screening for depression: Secondary | ICD-10-CM | POA: Diagnosis not present

## 2022-02-22 DIAGNOSIS — Z0000 Encounter for general adult medical examination without abnormal findings: Secondary | ICD-10-CM | POA: Diagnosis not present

## 2022-02-22 DIAGNOSIS — G720 Drug-induced myopathy: Secondary | ICD-10-CM | POA: Diagnosis not present

## 2022-02-22 DIAGNOSIS — E1129 Type 2 diabetes mellitus with other diabetic kidney complication: Secondary | ICD-10-CM | POA: Diagnosis not present

## 2022-02-22 DIAGNOSIS — Z6824 Body mass index (BMI) 24.0-24.9, adult: Secondary | ICD-10-CM | POA: Diagnosis not present

## 2022-02-22 DIAGNOSIS — N951 Menopausal and female climacteric states: Secondary | ICD-10-CM | POA: Diagnosis not present

## 2022-02-22 DIAGNOSIS — T466X5A Adverse effect of antihyperlipidemic and antiarteriosclerotic drugs, initial encounter: Secondary | ICD-10-CM | POA: Diagnosis not present

## 2022-02-22 DIAGNOSIS — R809 Proteinuria, unspecified: Secondary | ICD-10-CM | POA: Diagnosis not present

## 2022-03-16 DIAGNOSIS — J069 Acute upper respiratory infection, unspecified: Secondary | ICD-10-CM | POA: Diagnosis not present

## 2022-03-16 DIAGNOSIS — Z20822 Contact with and (suspected) exposure to covid-19: Secondary | ICD-10-CM | POA: Diagnosis not present

## 2022-03-16 DIAGNOSIS — J019 Acute sinusitis, unspecified: Secondary | ICD-10-CM | POA: Diagnosis not present

## 2022-05-11 DIAGNOSIS — E1129 Type 2 diabetes mellitus with other diabetic kidney complication: Secondary | ICD-10-CM | POA: Diagnosis not present

## 2022-05-11 DIAGNOSIS — Z79899 Other long term (current) drug therapy: Secondary | ICD-10-CM | POA: Diagnosis not present

## 2022-05-11 DIAGNOSIS — R809 Proteinuria, unspecified: Secondary | ICD-10-CM | POA: Diagnosis not present

## 2022-05-11 DIAGNOSIS — M8580 Other specified disorders of bone density and structure, unspecified site: Secondary | ICD-10-CM | POA: Diagnosis not present

## 2022-05-11 DIAGNOSIS — E785 Hyperlipidemia, unspecified: Secondary | ICD-10-CM | POA: Diagnosis not present

## 2022-07-26 ENCOUNTER — Other Ambulatory Visit: Payer: Self-pay | Admitting: Family Medicine

## 2022-07-26 DIAGNOSIS — Z1231 Encounter for screening mammogram for malignant neoplasm of breast: Secondary | ICD-10-CM

## 2022-08-17 ENCOUNTER — Ambulatory Visit
Admission: RE | Admit: 2022-08-17 | Discharge: 2022-08-17 | Disposition: A | Discharge status: Home / Self Care | Payer: HMO | Source: Ambulatory Visit | Attending: Family Medicine | Admitting: Family Medicine

## 2022-08-17 DIAGNOSIS — Z1231 Encounter for screening mammogram for malignant neoplasm of breast: Secondary | ICD-10-CM | POA: Diagnosis not present

## 2023-02-22 DIAGNOSIS — K2100 Gastro-esophageal reflux disease with esophagitis, without bleeding: Secondary | ICD-10-CM | POA: Diagnosis not present

## 2023-02-22 DIAGNOSIS — Z79899 Other long term (current) drug therapy: Secondary | ICD-10-CM | POA: Diagnosis not present

## 2023-02-22 DIAGNOSIS — Z0000 Encounter for general adult medical examination without abnormal findings: Secondary | ICD-10-CM | POA: Diagnosis not present

## 2023-02-22 DIAGNOSIS — T466X5A Adverse effect of antihyperlipidemic and antiarteriosclerotic drugs, initial encounter: Secondary | ICD-10-CM | POA: Diagnosis not present

## 2023-02-22 DIAGNOSIS — M8580 Other specified disorders of bone density and structure, unspecified site: Secondary | ICD-10-CM | POA: Diagnosis not present

## 2023-02-22 DIAGNOSIS — R809 Proteinuria, unspecified: Secondary | ICD-10-CM | POA: Diagnosis not present

## 2023-02-22 DIAGNOSIS — L658 Other specified nonscarring hair loss: Secondary | ICD-10-CM | POA: Diagnosis not present

## 2023-02-22 DIAGNOSIS — G63 Polyneuropathy in diseases classified elsewhere: Secondary | ICD-10-CM | POA: Diagnosis not present

## 2023-02-22 DIAGNOSIS — N951 Menopausal and female climacteric states: Secondary | ICD-10-CM | POA: Diagnosis not present

## 2023-02-22 DIAGNOSIS — E1129 Type 2 diabetes mellitus with other diabetic kidney complication: Secondary | ICD-10-CM | POA: Diagnosis not present

## 2023-02-22 DIAGNOSIS — E785 Hyperlipidemia, unspecified: Secondary | ICD-10-CM | POA: Diagnosis not present

## 2023-02-22 DIAGNOSIS — G720 Drug-induced myopathy: Secondary | ICD-10-CM | POA: Diagnosis not present

## 2023-03-07 DIAGNOSIS — G8929 Other chronic pain: Secondary | ICD-10-CM | POA: Diagnosis not present

## 2023-03-07 DIAGNOSIS — M25512 Pain in left shoulder: Secondary | ICD-10-CM | POA: Diagnosis not present

## 2023-07-10 DIAGNOSIS — R809 Proteinuria, unspecified: Secondary | ICD-10-CM | POA: Diagnosis not present

## 2023-07-10 DIAGNOSIS — E1129 Type 2 diabetes mellitus with other diabetic kidney complication: Secondary | ICD-10-CM | POA: Diagnosis not present

## 2023-07-13 DIAGNOSIS — M8580 Other specified disorders of bone density and structure, unspecified site: Secondary | ICD-10-CM | POA: Diagnosis not present

## 2023-07-13 DIAGNOSIS — R809 Proteinuria, unspecified: Secondary | ICD-10-CM | POA: Diagnosis not present

## 2023-07-13 DIAGNOSIS — Z6824 Body mass index (BMI) 24.0-24.9, adult: Secondary | ICD-10-CM | POA: Diagnosis not present

## 2023-07-13 DIAGNOSIS — N951 Menopausal and female climacteric states: Secondary | ICD-10-CM | POA: Diagnosis not present

## 2023-07-13 DIAGNOSIS — E1129 Type 2 diabetes mellitus with other diabetic kidney complication: Secondary | ICD-10-CM | POA: Diagnosis not present

## 2023-07-13 DIAGNOSIS — K2100 Gastro-esophageal reflux disease with esophagitis, without bleeding: Secondary | ICD-10-CM | POA: Diagnosis not present

## 2023-07-13 DIAGNOSIS — T466X5A Adverse effect of antihyperlipidemic and antiarteriosclerotic drugs, initial encounter: Secondary | ICD-10-CM | POA: Diagnosis not present

## 2023-07-13 DIAGNOSIS — G720 Drug-induced myopathy: Secondary | ICD-10-CM | POA: Diagnosis not present

## 2023-07-13 DIAGNOSIS — E785 Hyperlipidemia, unspecified: Secondary | ICD-10-CM | POA: Diagnosis not present

## 2023-07-13 DIAGNOSIS — G63 Polyneuropathy in diseases classified elsewhere: Secondary | ICD-10-CM | POA: Diagnosis not present

## 2023-07-26 ENCOUNTER — Other Ambulatory Visit: Payer: Self-pay | Admitting: Family Medicine

## 2023-07-26 ENCOUNTER — Encounter: Payer: Self-pay | Admitting: Family Medicine

## 2023-07-26 DIAGNOSIS — Z0000 Encounter for general adult medical examination without abnormal findings: Secondary | ICD-10-CM

## 2023-08-09 DIAGNOSIS — E1129 Type 2 diabetes mellitus with other diabetic kidney complication: Secondary | ICD-10-CM | POA: Diagnosis not present

## 2023-08-09 DIAGNOSIS — Z713 Dietary counseling and surveillance: Secondary | ICD-10-CM | POA: Diagnosis not present

## 2023-08-21 ENCOUNTER — Ambulatory Visit
Admission: RE | Admit: 2023-08-21 | Discharge: 2023-08-21 | Disposition: A | Discharge status: Home / Self Care | Payer: PPO | Source: Ambulatory Visit | Attending: Family Medicine | Admitting: Family Medicine

## 2023-08-21 DIAGNOSIS — Z0000 Encounter for general adult medical examination without abnormal findings: Secondary | ICD-10-CM

## 2023-08-21 DIAGNOSIS — Z1231 Encounter for screening mammogram for malignant neoplasm of breast: Secondary | ICD-10-CM | POA: Diagnosis not present

## 2023-10-16 DIAGNOSIS — E1129 Type 2 diabetes mellitus with other diabetic kidney complication: Secondary | ICD-10-CM | POA: Diagnosis not present

## 2023-10-16 DIAGNOSIS — R809 Proteinuria, unspecified: Secondary | ICD-10-CM | POA: Diagnosis not present

## 2023-10-17 DIAGNOSIS — K580 Irritable bowel syndrome with diarrhea: Secondary | ICD-10-CM | POA: Diagnosis not present

## 2023-10-17 DIAGNOSIS — G720 Drug-induced myopathy: Secondary | ICD-10-CM | POA: Diagnosis not present

## 2023-10-17 DIAGNOSIS — M8580 Other specified disorders of bone density and structure, unspecified site: Secondary | ICD-10-CM | POA: Diagnosis not present

## 2023-10-17 DIAGNOSIS — E1129 Type 2 diabetes mellitus with other diabetic kidney complication: Secondary | ICD-10-CM | POA: Diagnosis not present

## 2023-10-17 DIAGNOSIS — E785 Hyperlipidemia, unspecified: Secondary | ICD-10-CM | POA: Diagnosis not present

## 2023-10-17 DIAGNOSIS — L658 Other specified nonscarring hair loss: Secondary | ICD-10-CM | POA: Diagnosis not present

## 2023-10-17 DIAGNOSIS — K2100 Gastro-esophageal reflux disease with esophagitis, without bleeding: Secondary | ICD-10-CM | POA: Diagnosis not present

## 2023-10-17 DIAGNOSIS — R809 Proteinuria, unspecified: Secondary | ICD-10-CM | POA: Diagnosis not present

## 2023-10-17 DIAGNOSIS — E663 Overweight: Secondary | ICD-10-CM | POA: Diagnosis not present

## 2023-10-17 DIAGNOSIS — G63 Polyneuropathy in diseases classified elsewhere: Secondary | ICD-10-CM | POA: Diagnosis not present

## 2023-10-17 DIAGNOSIS — N951 Menopausal and female climacteric states: Secondary | ICD-10-CM | POA: Diagnosis not present

## 2023-10-17 DIAGNOSIS — T466X5A Adverse effect of antihyperlipidemic and antiarteriosclerotic drugs, initial encounter: Secondary | ICD-10-CM | POA: Diagnosis not present

## 2024-03-19 DIAGNOSIS — R809 Proteinuria, unspecified: Secondary | ICD-10-CM | POA: Diagnosis not present

## 2024-03-19 DIAGNOSIS — N3000 Acute cystitis without hematuria: Secondary | ICD-10-CM | POA: Diagnosis not present

## 2024-03-19 DIAGNOSIS — R21 Rash and other nonspecific skin eruption: Secondary | ICD-10-CM | POA: Diagnosis not present

## 2024-03-19 DIAGNOSIS — E1129 Type 2 diabetes mellitus with other diabetic kidney complication: Secondary | ICD-10-CM | POA: Diagnosis not present

## 2024-03-19 DIAGNOSIS — E785 Hyperlipidemia, unspecified: Secondary | ICD-10-CM | POA: Diagnosis not present

## 2024-03-21 DIAGNOSIS — R809 Proteinuria, unspecified: Secondary | ICD-10-CM | POA: Diagnosis not present

## 2024-03-21 DIAGNOSIS — M8580 Other specified disorders of bone density and structure, unspecified site: Secondary | ICD-10-CM | POA: Diagnosis not present

## 2024-03-21 DIAGNOSIS — N3000 Acute cystitis without hematuria: Secondary | ICD-10-CM | POA: Diagnosis not present

## 2024-03-21 DIAGNOSIS — B3731 Acute candidiasis of vulva and vagina: Secondary | ICD-10-CM | POA: Diagnosis not present

## 2024-03-21 DIAGNOSIS — E1129 Type 2 diabetes mellitus with other diabetic kidney complication: Secondary | ICD-10-CM | POA: Diagnosis not present

## 2024-03-21 DIAGNOSIS — N951 Menopausal and female climacteric states: Secondary | ICD-10-CM | POA: Diagnosis not present

## 2024-03-21 DIAGNOSIS — T466X5A Adverse effect of antihyperlipidemic and antiarteriosclerotic drugs, initial encounter: Secondary | ICD-10-CM | POA: Diagnosis not present

## 2024-03-21 DIAGNOSIS — G63 Polyneuropathy in diseases classified elsewhere: Secondary | ICD-10-CM | POA: Diagnosis not present

## 2024-03-21 DIAGNOSIS — G720 Drug-induced myopathy: Secondary | ICD-10-CM | POA: Diagnosis not present

## 2024-03-21 DIAGNOSIS — E785 Hyperlipidemia, unspecified: Secondary | ICD-10-CM | POA: Diagnosis not present

## 2024-03-21 DIAGNOSIS — L658 Other specified nonscarring hair loss: Secondary | ICD-10-CM | POA: Diagnosis not present

## 2024-03-21 DIAGNOSIS — Z0000 Encounter for general adult medical examination without abnormal findings: Secondary | ICD-10-CM | POA: Diagnosis not present

## 2024-06-14 DIAGNOSIS — E113293 Type 2 diabetes mellitus with mild nonproliferative diabetic retinopathy without macular edema, bilateral: Secondary | ICD-10-CM | POA: Diagnosis not present

## 2024-06-14 DIAGNOSIS — H524 Presbyopia: Secondary | ICD-10-CM | POA: Diagnosis not present

## 2024-06-20 DIAGNOSIS — E785 Hyperlipidemia, unspecified: Secondary | ICD-10-CM | POA: Diagnosis not present

## 2024-06-20 DIAGNOSIS — R809 Proteinuria, unspecified: Secondary | ICD-10-CM | POA: Diagnosis not present

## 2024-06-20 DIAGNOSIS — E1129 Type 2 diabetes mellitus with other diabetic kidney complication: Secondary | ICD-10-CM | POA: Diagnosis not present

## 2024-06-21 DIAGNOSIS — G63 Polyneuropathy in diseases classified elsewhere: Secondary | ICD-10-CM | POA: Diagnosis not present

## 2024-06-21 DIAGNOSIS — R809 Proteinuria, unspecified: Secondary | ICD-10-CM | POA: Diagnosis not present

## 2024-06-21 DIAGNOSIS — G720 Drug-induced myopathy: Secondary | ICD-10-CM | POA: Diagnosis not present

## 2024-06-21 DIAGNOSIS — Z794 Long term (current) use of insulin: Secondary | ICD-10-CM | POA: Diagnosis not present

## 2024-06-21 DIAGNOSIS — E1129 Type 2 diabetes mellitus with other diabetic kidney complication: Secondary | ICD-10-CM | POA: Diagnosis not present

## 2024-06-21 DIAGNOSIS — E113293 Type 2 diabetes mellitus with mild nonproliferative diabetic retinopathy without macular edema, bilateral: Secondary | ICD-10-CM | POA: Diagnosis not present

## 2024-06-21 DIAGNOSIS — T466X5A Adverse effect of antihyperlipidemic and antiarteriosclerotic drugs, initial encounter: Secondary | ICD-10-CM | POA: Diagnosis not present

## 2024-06-21 DIAGNOSIS — Z6824 Body mass index (BMI) 24.0-24.9, adult: Secondary | ICD-10-CM | POA: Diagnosis not present

## 2024-06-21 DIAGNOSIS — E785 Hyperlipidemia, unspecified: Secondary | ICD-10-CM | POA: Diagnosis not present

## 2024-09-30 DIAGNOSIS — L578 Other skin changes due to chronic exposure to nonionizing radiation: Secondary | ICD-10-CM | POA: Diagnosis not present

## 2024-09-30 DIAGNOSIS — L820 Inflamed seborrheic keratosis: Secondary | ICD-10-CM | POA: Diagnosis not present

## 2024-09-30 DIAGNOSIS — L821 Other seborrheic keratosis: Secondary | ICD-10-CM | POA: Diagnosis not present

## 2024-10-16 ENCOUNTER — Other Ambulatory Visit: Payer: Self-pay | Admitting: Family Medicine

## 2024-10-16 DIAGNOSIS — Z1231 Encounter for screening mammogram for malignant neoplasm of breast: Secondary | ICD-10-CM

## 2024-11-08 ENCOUNTER — Ambulatory Visit

## 2024-11-13 ENCOUNTER — Inpatient Hospital Stay: Admission: RE | Admit: 2024-11-13 | Discharge: 2024-11-13 | Attending: Family Medicine | Admitting: Family Medicine

## 2024-11-13 DIAGNOSIS — Z1231 Encounter for screening mammogram for malignant neoplasm of breast: Secondary | ICD-10-CM

## 2024-12-02 NOTE — Progress Notes (Unsigned)
 " Cardiology Office Note:    Date:  12/04/2024   ID:  [name], DOB 16-Jul-1951, MRN [id number]  PCP:  Kristi Lauraine DASEN, NP  Cardiologist:  Kristi Leiter, MD   Referring MD: Kristi Lauraine DASEN, NP  ASSESSMENT:    1. ECG abnormality   2. Type 2 diabetes mellitus without complication, without long-term current use of insulin (HCC)   3. Primary hypertension    PLAN:    In order of problems listed above:  EKG abnormality ST-T in the context of hypertension stage II likely represents LVH repolarization Switch to a more potent ARB thiazide diuretic combination new blood pressure cuff validated good technique and could be a list of blood pressures in 2 weeks Return in 2 weeks and have renal function BMP and aldosterone renin level performed to screen for secondary hyperaldosteronism Renovascular duplex evaluate for renal artery stenosis Echocardiogram for structural heart disease and correlate with EKG I do not think she needs an ischemia evaluation Given education and good technique for checking blood pressure and DASH diet  Next appointment 4 weeks   Medication Adjustments/Labs and Tests Ordered: Current medicines are reviewed at length with the patient today.  Concerns regarding medicines are outlined above.  Orders Placed This Encounter  Procedures   Basic Metabolic Panel (BMET)   Aldosterone + renin activity w/ ratio   EKG 12-Lead   ECHOCARDIOGRAM COMPLETE   Meds ordered this encounter  Medications   telmisartan -hydrochlorothiazide (MICARDIS  HCT) 40-12.5 MG tablet    Sig: Take 1 tablet by mouth daily.    Dispense:  90 tablet    Refill:  3      History of Present Illness:    [name] is a 74 y.o. female who is being seen today for the evaluation of an abnormal EKG at the request of Kristi Lauraine DASEN, NP. Chart review shows a previous cardiac evaluation or diagnostic cardiac imaging.  She had an EKG performed 11/23/2024 at [facility] ED showing sinus rhythm  abnormal R wave progression lateral T wave inversion consider ischemia.  She was seen in the emergency room for abdominal and back pain blood pressure was 174/78 heart rate 81 she had CT done with compression fracture she was treated with nonsteroidal anti-inflammatory drug and arrangements for follow-up in Ortho CBC showed a hemoglobin of 13.5 platelets 100,000 creatinine 0.5 potassium 4.0 there is notation her EKG showed T wave inversions.  She was advised to see cardiology as an outpatient.  She has had elevated blood pressure in the past but no diagnosis of hypertension and had not been on blood pressure lowering medication In the ED she was started on a low-dose of ARB losartan 25 mg daily Unfortunately she is checking her blood pressure with a wrist cuff that is unreliable her numbers at home are running in the range of 140 systolic with that device Today in my office recheck at rest 182/78 She clearly has hypertension I think her EKG shows LVH and repolarization. She has no history of heart disease congenital rheumatic or atrial fibrillation She is not having chest pain edema shortness of breath palpitation or syncope  Past Medical History:  Diagnosis Date   Diabetes (HCC) 02/06/2021   Hypertriglyceridemia 04/07/2021   Numbness 04/07/2021    History reviewed. No pertinent surgical history.  Current Medications: Active Medications[1]   Allergies:   Patient has no known allergies.   Social History   Socioeconomic History   Marital status: Widowed    Spouse name: Not  on file   Number of children: Not on file   Years of education: Not on file   Highest education level: Not on file  Occupational History   Not on file  Tobacco Use   Smoking status: Not on file   Smokeless tobacco: Not on file  Substance and Sexual Activity   Alcohol use: Not on file   Drug use: Not on file   Sexual activity: Not on file  Other Topics Concern   Not on file  Social History Narrative   Not  on file   Social Drivers of Health   Tobacco Use: Not on file  Financial Resource Strain: Not on file  Food Insecurity: Not on file  Transportation Needs: Not on file  Physical Activity: Not on file  Stress: Not on file  Social Connections: Not on file  Depression (EYV7-0): Not on file  Alcohol Screen: Not on file  Housing: Not on file  Utilities: Not on file  Health Literacy: Not on file     Family History: The patient's family history includes Breast cancer in her sister and another family member; Diabetes in her mother.  ROS:   ROS Please see the history of present illness.     All other systems reviewed and are negative.  EKGs/Labs/Other Studies Reviewed:    The following studies were reviewed today:      EKG today again shows ST-T abnormality more prominent than the EKG in the emergency room     Physical Exam:    VS:  BP (!) 180/90   Pulse 83   Ht 5' 5.5 (1.664 m)   Wt 156 lb 6.4 oz (70.9 kg)   SpO2 95%   BMI 25.63 kg/m     Wt Readings from Last 3 Encounters:  12/04/24 156 lb 6.4 oz (70.9 kg)  07/13/21 146 lb 6.4 oz (66.4 kg)  04/07/21 146 lb (66.2 kg)     GEN:  Well nourished, well developed in no acute distress HEENT: Normal NECK: No JVD; No carotid bruits LYMPHATICS: No lymphadenopathy CARDIAC: RRR, no murmurs, rubs, gallops RESPIRATORY:  Clear to auscultation without rales, wheezing or rhonchi  ABDOMEN: Soft, non-tender, non-distended MUSCULOSKELETAL:  No edema; No deformity  SKIN: Warm and dry NEUROLOGIC:  Alert and oriented x 3 PSYCHIATRIC:  Normal affect     Signed, Kristi Leiter, MD  12/04/2024 4:32 PM    [facility]     [1]  Current Meds  Medication Sig   aspirin EC 81 MG tablet Take 81 mg by mouth daily. Swallow whole.   DROPLET PEN NEEDLES 32G X 4 MM MISC    famotidine (PEPCID) 40 MG tablet Take 40 mg by mouth at bedtime as needed.   LANTUS SOLOSTAR 100 UNIT/ML Solostar Pen Inject into the skin 2  (two) times daily.   metFORMIN  (GLUCOPHAGE -XR) 500 MG 24 hr tablet Take 4 tablets (2,000 mg total) by mouth daily with breakfast.   methocarbamol (ROBAXIN) 750 MG tablet Take 750 mg by mouth 3 (three) times daily.   naproxen (NAPROSYN) 500 MG tablet Take 500 mg by mouth 2 (two) times daily.   telmisartan -hydrochlorothiazide (MICARDIS  HCT) 40-12.5 MG tablet Take 1 tablet by mouth daily.   [DISCONTINUED] losartan (COZAAR) 25 MG tablet Take 25 mg by mouth daily.   "

## 2024-12-04 ENCOUNTER — Encounter: Payer: Self-pay | Admitting: Cardiology

## 2024-12-04 ENCOUNTER — Ambulatory Visit: Attending: Cardiology | Admitting: Cardiology

## 2024-12-04 VITALS — BP 180/90 | HR 83 | Ht 65.5 in | Wt 156.4 lb

## 2024-12-04 DIAGNOSIS — R9431 Abnormal electrocardiogram [ECG] [EKG]: Secondary | ICD-10-CM

## 2024-12-04 DIAGNOSIS — I10 Essential (primary) hypertension: Secondary | ICD-10-CM | POA: Diagnosis not present

## 2024-12-04 DIAGNOSIS — E119 Type 2 diabetes mellitus without complications: Secondary | ICD-10-CM | POA: Diagnosis not present

## 2024-12-04 MED ORDER — TELMISARTAN-HCTZ 40-12.5 MG PO TABS: 1.0000 | ORAL_TABLET | Freq: Every day | ORAL | 3 refills | Status: AC

## 2024-12-04 NOTE — Patient Instructions (Addendum)
 Medication Instructions:  Your physician has recommended you make the following change in your medication:   STOP: Losartan START: Telmisartan / hydrochlorothiazide 40/12.5 mg one tablet daily  *If you need a refill on your cardiac medications before your next appointment, please call your pharmacy*  Lab Work: Your physician recommends that you return for lab work in:   Labs in 1 week: BMP, Aldosterone/ Renin level  If you have labs (blood work) drawn today and your tests are completely normal, you will receive your results only by: MyChart Message (if you have MyChart) OR A paper copy in the mail If you have any lab test that is abnormal or we need to change your treatment, we will call you to review the results.  Testing/Procedures: Your physician has requested that you have an echocardiogram. Echocardiography is a painless test that uses sound waves to create images of your heart. It provides your doctor with information about the size and shape of your heart and how well your hearts chambers and valves are working. This procedure takes approximately one hour. There are no restrictions for this procedure. Please do NOT wear cologne, perfume, aftershave, or lotions (deodorant is allowed). Please arrive 15 minutes prior to your appointment time.  Please note: We ask at that you not bring children with you during ultrasound (echo/ vascular) testing. Due to room size and safety concerns, children are not allowed in the ultrasound rooms during exams. Our front office staff cannot provide observation of children in our lobby area while testing is being conducted. An adult accompanying a patient to their appointment will only be allowed in the ultrasound room at the discretion of the ultrasound technician under special circumstances. We apologize for any inconvenience.   Follow-Up: At [facility], you and your health needs are our priority.  As part of our continuing mission to  provide you with exceptional heart care, our providers are all part of one team.  This team includes your primary Cardiologist (physician) and Advanced Practice Providers or APPs (Physician Assistants and Nurse Practitioners) who all work together to provide you with the care you need, when you need it.  Your next appointment:   4 week(s)  Provider:   Redell Leiter, MD    We recommend signing up for the patient portal called MyChart.  Sign up information is provided on this After Visit Summary.  MyChart is used to connect with patients for Virtual Visits (Telemedicine).  Patients are able to view lab/test results, encounter notes, upcoming appointments, etc.  Non-urgent messages can be sent to your provider as well.   To learn more about what you can do with MyChart, go to [email or web address].   Other Instructions  Purchase an [employer] blood pressure device  Please keep a BP log for 2 weeks and send by MyChart or mail.                      Dr. Leiter [address] [place], [place] [address]  Blood Pressure Record Sheet To take your blood pressure, you will need a blood pressure machine. You can buy a blood pressure machine (blood pressure monitor) at your clinic, drug store, or online. When choosing one, consider: An automatic monitor that has an arm cuff. A cuff that wraps snugly around your upper arm. You should be able to fit only one finger between your arm and the cuff. A device that stores blood pressure reading results. Do not choose a monitor that measures your  blood pressure from your wrist or finger. Follow your health care provider's instructions for how to take your blood pressure. To use this form: Get one reading in the morning (a.m.) 1-2 hours after you take any medicines. Get one reading in the evening (p.m.) before supper.   Blood pressure log Date: _______________________  a.m. _____________________(1st reading) HR___________            p.m.  _____________________(2nd reading) HR__________  Date: _______________________  a.m. _____________________(1st reading) HR___________            p.m. _____________________(2nd reading) HR__________  Date: _______________________  a.m. _____________________(1st reading) HR___________            p.m. _____________________(2nd reading) HR__________  Date: _______________________  a.m. _____________________(1st reading) HR___________            p.m. _____________________(2nd reading) HR__________  Date: _______________________  a.m. _____________________(1st reading) HR___________            p.m. _____________________(2nd reading) HR__________  Date: _______________________  a.m. _____________________(1st reading) HR___________            p.m. _____________________(2nd reading) HR__________  Date: _______________________  a.m. _____________________(1st reading) HR___________            p.m. _____________________(2nd reading) HR__________   This information is not intended to replace advice given to you by your health care provider. Make sure you discuss any questions you have with your health care provider. Document Revised: 02/19/2020 Document Reviewed: 02/19/2020 Elsevier Patient Education  2021 [employer]               [employer]  Tips to measure your blood pressure correctly  To determine whether you have hypertension, a medical professional will take a blood pressure reading. How you prepare for the test, the position of your arm, and other factors can change a blood pressure reading by 10% or more. That could be enough to hide high blood pressure, start you on a drug you don't really need, or lead your doctor to incorrectly adjust your medications. National and international guidelines offer specific instructions for measuring blood pressure. If a doctor, nurse, or medical assistant isn't doing it right, don't hesitate to ask him or her to get with the  guidelines. Here's what you can do to ensure a correct reading:  Don't drink a caffeinated beverage or smoke during the 30 minutes before the test.  Sit quietly for five minutes before the test begins.  During the measurement, sit in a chair with your feet on the floor and your arm supported so your elbow is at about heart level.  The inflatable part of the cuff should completely cover at least 80% of your upper arm, and the cuff should be placed on bare skin, not over a shirt.  Don't talk during the measurement.  Have your blood pressure measured twice, with a brief break in between. If the readings are different by 5 points or more, have it done a third time. There are times to break these rules. If you sometimes feel lightheaded when getting out of bed in the morning or when you stand after sitting, you should have your blood pressure checked while seated and then while standing to see if it falls from one position to the next. Because blood pressure varies throughout the day, your doctor will rarely diagnose hypertension on the basis of a single reading. Instead, he or she will want to confirm the measurements on at least two occasions, usually within a few weeks of one  another. The exception to this rule is if you have a blood pressure reading of 180/110 mm Hg or higher. A result this high usually calls for prompt treatment. It's also a good idea to have your blood pressure measured in both arms at least once, since the reading in one arm (usually the right) may be higher than that in the left. A 2014 study in The American Journal of Medicine of nearly 3,400 people found average arm- to-arm differences in systolic blood pressure of about 5 points. The higher number should be used to make treatment decisions. In 2017, new guidelines from the American Heart Association, the [employer] of [employer], and nine other health organizations lowered the diagnosis of high blood pressure to 130/80 mm Hg  or higher for all adults. The guidelines also redefined the various blood pressure categories to now include normal, elevated, Stage 1 hypertension, Stage 2 hypertension, and hypertensive crisis (see Blood pressure categories). Blood pressure categories  Blood pressure category SYSTOLIC (upper number)  DIASTOLIC (lower number)  Normal Less than 120 mm Hg and Less than 80 mm Hg  Elevated 120-129 mm Hg and Less than 80 mm Hg  High blood pressure: Stage 1 hypertension 130-139 mm Hg or 80-89 mm Hg  High blood pressure: Stage 2 hypertension 140 mm Hg or higher or 90 mm Hg or higher  Hypertensive crisis (consult your doctor immediately) Higher than 180 mm Hg and/or Higher than 120 mm Hg  Source: American Heart Association and American Stroke Association. For more on getting your blood pressure under control, buy Controlling Your Blood Pressure, a Special Health Report from [facility].   DASH diet: Healthy eating to lower your blood pressure The DASH diet emphasizes portion size, eating a variety of foods and getting the right amount of nutrients. Discover how DASH can improve your health and lower your blood pressure. By [facility] Staff  DASH stands for Dietary Approaches to Stop Hypertension. The DASH diet is a lifelong approach to healthy eating that's designed to help treat or prevent high blood pressure (hypertension). The DASH diet encourages you to reduce the sodium in your diet and eat a variety of foods rich in nutrients that help lower blood pressure, such as potassium, calcium and magnesium. By following the DASH diet, you may be able to reduce your blood pressure by a few points in just two weeks. Over time, your systolic blood pressure could drop by eight to 14 points, which can make a significant difference in your health risks. Because the DASH diet is a healthy way of eating, it offers health benefits besides just lowering blood pressure. The DASH diet is also in line with  dietary recommendations to prevent osteoporosis, cancer, heart disease, stroke and diabetes. DASH diet: Sodium levels The DASH diet emphasizes vegetables, fruits and low-fat dairy foods -- and moderate amounts of whole grains, fish, poultry and nuts. In addition to the standard DASH diet, there is also a lower sodium version of the diet. You can choose the version of the diet that meets your health needs: Standard DASH diet. You can consume up to 2,300 milligrams (mg) of sodium a day.  Lower sodium DASH diet. You can consume up to 1,500 mg of sodium a day. Both versions of the DASH diet aim to reduce the amount of sodium in your diet compared with what you might get in a typical American diet, which can amount to a whopping 3,400 mg of sodium a day or more. The  standard DASH diet meets the recommendation from the Dietary Guidelines for Americans to keep daily sodium intake to less than 2,300 mg a day. The American Heart Association recommends 1,500 mg a day of sodium as an upper limit for all adults. If you aren't sure what sodium level is right for you, talk to your doctor. DASH diet: What to eat Both versions of the DASH diet include lots of whole grains, fruits, vegetables and low-fat dairy products. The DASH diet also includes some fish, poultry and legumes, and encourages a small amount of nuts and seeds a few times a week.  You can eat red meat, sweets and fats in small amounts. The DASH diet is low in saturated fat, cholesterol and total fat. Here's a look at the recommended servings from each food group for the 2,000-calorie-a-day DASH diet. Grains: 6 to 8 servings a day Grains include bread, cereal, rice and pasta. Examples of one serving of grains include 1 slice whole-wheat bread, 1 ounce dry cereal, or 1/2 cup cooked cereal, rice or pasta. Focus on whole grains because they have more fiber and nutrients than do refined grains. For instance, use brown rice instead of white rice, whole-wheat  pasta instead of regular pasta and whole-grain bread instead of white bread. Look for products labeled 100 percent whole grain or 100 percent whole wheat.  Grains are naturally low in fat. Keep them this way by avoiding butter, cream and cheese sauces. Vegetables: 4 to 5 servings a day Tomatoes, carrots, broccoli, sweet potatoes, greens and other vegetables are full of fiber, vitamins, and such minerals as potassium and magnesium. Examples of one serving include 1 cup raw leafy green vegetables or 1/2 cup cut-up raw or cooked vegetables. Don't think of vegetables only as side dishes -- a hearty blend of vegetables served over brown rice or whole-wheat noodles can serve as the main dish for a meal.  Fresh and frozen vegetables are both good choices. When buying frozen and canned vegetables, choose those labeled as low sodium or without added salt.  To increase the number of servings you fit in daily, be creative. In a stir-fry, for instance, cut the amount of meat in half and double up on the vegetables. Fruits: 4 to 5 servings a day Many fruits need little preparation to become a healthy part of a meal or snack. Like vegetables, they're packed with fiber, potassium and magnesium and are typically low in fat -- coconuts are an exception. Examples of one serving include one medium fruit, 1/2 cup fresh, frozen or canned fruit, or 4 ounces of juice. Have a piece of fruit with meals and one as a snack, then round out your day with a dessert of fresh fruits topped with a dollop of low-fat yogurt.  Leave on edible peels whenever possible. The peels of apples, pears and most fruits with pits add interesting texture to recipes and contain healthy nutrients and fiber.  Remember that citrus fruits and juices, such as grapefruit, can interact with certain medications, so check with your doctor or pharmacist to see if they're OK for you.  If you choose canned fruit or juice, make sure no sugar is added. Dairy: 2  to 3 servings a day Milk, yogurt, cheese and other dairy products are major sources of calcium, vitamin D and protein. But the key is to make sure that you choose dairy products that are low fat or fat-free because otherwise they can be a major source of fat -- and most  of it is saturated. Examples of one serving include 1 cup skim or 1 percent milk, 1 cup low fat yogurt, or 1 1/2 ounces part-skim cheese. Low-fat or fat-free frozen yogurt can help you boost the amount of dairy products you eat while offering a sweet treat. Add fruit for a healthy twist.  If you have trouble digesting dairy products, choose lactose-free products or consider taking an over-the-counter product that contains the enzyme lactase, which can reduce or prevent the symptoms of lactose intolerance.  Go easy on regular and even fat-free cheeses because they are typically high in sodium. Lean meat, poultry and fish: 6 servings or fewer a day Meat can be a rich source of protein, B vitamins, iron and zinc. Choose lean varieties and aim for no more than 6 ounces a day. Cutting back on your meat portion will allow room for more vegetables. Trim away skin and fat from poultry and meat and then bake, broil, grill or roast instead of frying in fat.  Eat heart-healthy fish, such as salmon, herring and tuna. These types of fish are high in omega-3 fatty acids, which can help lower your total cholesterol. Nuts, seeds and legumes: 4 to 5 servings a week Almonds, sunflower seeds, kidney beans, peas, lentils and other foods in this family are good sources of magnesium, potassium and protein. They're also full of fiber and phytochemicals, which are plant compounds that may protect against some cancers and cardiovascular disease. Serving sizes are small and are intended to be consumed only a few times a week because these foods are high in calories. Examples of one serving include 1/3 cup nuts, 2 tablespoons seeds, or 1/2 cup cooked beans or peas.   Nuts sometimes get a bad rap because of their fat content, but they contain healthy types of fat -- monounsaturated fat and omega-3 fatty acids. They're high in calories, however, so eat them in moderation. Try adding them to stir-fries, salads or cereals.  Soybean-based products, such as tofu and tempeh, can be a good alternative to meat because they contain all of the amino acids your body needs to make a complete protein, just like meat. Fats and oils: 2 to 3 servings a day Fat helps your body absorb essential vitamins and helps your body's immune system. But too much fat increases your risk of heart disease, diabetes and obesity. The DASH diet strives for a healthy balance by limiting total fat to less than 30 percent of daily calories from fat, with a focus on the healthier monounsaturated fats. Examples of one serving include 1 teaspoon soft margarine, 1 tablespoon mayonnaise or 2 tablespoons salad dressing. Saturated fat and trans fat are the main dietary culprits in increasing your risk of coronary artery disease. DASH helps keep your daily saturated fat to less than 6 percent of your total calories by limiting use of meat, butter, cheese, whole milk, cream and eggs in your diet, along with foods made from lard, solid shortenings, and palm and coconut oils.  Avoid trans fat, commonly found in such processed foods as crackers, baked goods and fried items.  Read food labels on margarine and salad dressing so that you can choose those that are lowest in saturated fat and free of trans fat. Sweets: 5 servings or fewer a week You don't have to banish sweets entirely while following the DASH diet -- just go easy on them. Examples of one serving include 1 tablespoon sugar, jelly or jam, 1/2 cup sorbet, or 1 cup lemonade.  When you eat sweets, choose those that are fat-free or low-fat, such as sorbets, fruit ices, jelly beans, hard candy, graham crackers or low-fat cookies.  Artificial sweeteners such as  aspartame (NutraSweet, Equal) and sucralose (Splenda) may help satisfy your sweet tooth while sparing the sugar. But remember that you still must use them sensibly. It's OK to swap a diet cola for a regular cola, but not in place of a more nutritious beverage such as low-fat milk or even plain water.  Cut back on added sugar, which has no nutritional value but can pack on calories. DASH diet: Alcohol and caffeine Drinking too much alcohol can increase blood pressure. The Dietary Guidelines for Americans recommends that men limit alcohol to no more than two drinks a day and women to one or less. The DASH diet doesn't address caffeine consumption. The influence of caffeine on blood pressure remains unclear. But caffeine can cause your blood pressure to rise at least temporarily. If you already have high blood pressure or if you think caffeine is affecting your blood pressure, talk to your doctor about your caffeine consumption. DASH diet and weight loss While the DASH diet is not a weight-loss program, you may indeed lose unwanted pounds because it can help guide you toward healthier food choices. The DASH diet generally includes about 2,000 calories a day. If you're trying to lose weight, you may need to eat fewer calories. You may also need to adjust your serving goals based on your individual circumstances -- something your health care team can help you decide. Tips to cut back on sodium The foods at the core of the DASH diet are naturally low in sodium. So just by following the DASH diet, you're likely to reduce your sodium intake. You also reduce sodium further by: Using sodium-free spices or flavorings with your food instead of salt  Not adding salt when cooking rice, pasta or hot cereal  Rinsing canned foods to remove some of the sodium  Buying foods labeled no salt added, sodium-free, low sodium or very low sodium One teaspoon of table salt has 2,325 mg of sodium. When you read food  labels, you may be surprised at just how much sodium some processed foods contain. Even low-fat soups, canned vegetables, ready-to-eat cereals and sliced turkey from the local deli -- foods you may have considered healthy -- often have lots of sodium. You may notice a difference in taste when you choose low-sodium food and beverages. If things seem too bland, gradually introduce low-sodium foods and cut back on table salt until you reach your sodium goal. That'll give your palate time to adjust. Using salt-free seasoning blends or herbs and spices may also ease the transition. It can take several weeks for your taste buds to get used to less salty foods. Putting the pieces of the DASH diet together Try these strategies to get started on the DASH diet:  Change gradually. If you now eat only one or two servings of fruits or vegetables a day, try to add a serving at lunch and one at dinner. Rather than switching to all whole grains, start by making one or two of your grain servings whole grains. Increasing fruits, vegetables and whole grains gradually can also help prevent bloating or diarrhea that may occur if you aren't used to eating a diet with lots of fiber. You can also try over-the-counter products to help reduce gas from beans and vegetables.  Reward successes and forgive slip-ups. Reward yourself with a nonfood  treat for your accomplishments -- rent a movie, purchase a book or get together with a friend. Everyone slips, especially when learning something new. Remember that changing your lifestyle is a long-term process. Find out what triggered your setback and then just pick up where you left off with the DASH diet.  Add physical activity. To boost your blood pressure lowering efforts even more, consider increasing your physical activity in addition to following the DASH diet. Combining both the DASH diet and physical activity makes it more likely that you'll reduce your blood pressure.  Get support if  you need it. If you're having trouble sticking to your diet, talk to your doctor or dietitian about it. You might get some tips that will help you stick to the DASH diet. Remember, healthy eating isn't an all-or-nothing proposition. What's most important is that, on average, you eat healthier foods with plenty of variety -- both to keep your diet nutritious and to avoid boredom or extremes. And with the DASH diet, you can have both.

## 2025-01-01 ENCOUNTER — Ambulatory Visit

## 2025-01-09 ENCOUNTER — Ambulatory Visit: Admitting: Cardiology
